# Patient Record
Sex: Male | Born: 1945 | Race: White | Hispanic: No | Marital: Married | Smoking: Never smoker
Health system: Southern US, Community
[De-identification: ages and names within clinical notes are randomized; demographics above are authoritative.]

## PROBLEM LIST (undated history)

## (undated) DIAGNOSIS — Z950 Presence of cardiac pacemaker: Secondary | ICD-10-CM

## (undated) DIAGNOSIS — I700 Atherosclerosis of aorta: Secondary | ICD-10-CM

## (undated) DIAGNOSIS — R51 Headache: Secondary | ICD-10-CM

## (undated) DIAGNOSIS — F329 Major depressive disorder, single episode, unspecified: Secondary | ICD-10-CM

## (undated) DIAGNOSIS — K5792 Diverticulitis of intestine, part unspecified, without perforation or abscess without bleeding: Secondary | ICD-10-CM

## (undated) DIAGNOSIS — C61 Malignant neoplasm of prostate: Secondary | ICD-10-CM

## (undated) DIAGNOSIS — T8859XA Other complications of anesthesia, initial encounter: Secondary | ICD-10-CM

## (undated) DIAGNOSIS — R519 Headache, unspecified: Secondary | ICD-10-CM

## (undated) DIAGNOSIS — T4145XA Adverse effect of unspecified anesthetic, initial encounter: Secondary | ICD-10-CM

## (undated) DIAGNOSIS — I10 Essential (primary) hypertension: Secondary | ICD-10-CM

## (undated) DIAGNOSIS — I499 Cardiac arrhythmia, unspecified: Secondary | ICD-10-CM

## (undated) DIAGNOSIS — N183 Chronic kidney disease, stage 3 unspecified: Secondary | ICD-10-CM

## (undated) DIAGNOSIS — F32A Depression, unspecified: Secondary | ICD-10-CM

## (undated) DIAGNOSIS — I5020 Unspecified systolic (congestive) heart failure: Secondary | ICD-10-CM

## (undated) DIAGNOSIS — J189 Pneumonia, unspecified organism: Secondary | ICD-10-CM

## (undated) DIAGNOSIS — F419 Anxiety disorder, unspecified: Secondary | ICD-10-CM

## (undated) DIAGNOSIS — M1990 Unspecified osteoarthritis, unspecified site: Secondary | ICD-10-CM

## (undated) DIAGNOSIS — L989 Disorder of the skin and subcutaneous tissue, unspecified: Secondary | ICD-10-CM

## (undated) HISTORY — PX: BACK SURGERY: SHX140

## (undated) HISTORY — DX: Disorder of the skin and subcutaneous tissue, unspecified: L98.9

## (undated) HISTORY — PX: APPENDECTOMY: SHX54

## (undated) HISTORY — PX: CARDIAC CATHETERIZATION: SHX172

## (undated) HISTORY — PX: NASAL SINUS SURGERY: SHX719

## (undated) HISTORY — PX: VASECTOMY: SHX75

## (undated) HISTORY — PX: PROSTATECTOMY, SIMPLE, ROBOT ASSISTED, LAPAROSCOPIC: SHX7390

## (undated) HISTORY — DX: Unspecified systolic (congestive) heart failure: I50.20

## (undated) HISTORY — DX: Essential (primary) hypertension: I10

## (undated) HISTORY — PX: HERNIA REPAIR: SHX51

## (undated) HISTORY — DX: Chronic kidney disease, stage 3 unspecified: N18.30

## (undated) HISTORY — DX: Malignant neoplasm of prostate: C61

## (undated) HISTORY — DX: Atherosclerosis of aorta: I70.0

## (undated) HISTORY — PX: KNEE ARTHROSCOPY: SUR90

---

## 1999-11-28 ENCOUNTER — Inpatient Hospital Stay (HOSPITAL_COMMUNITY): Admission: EM | Admit: 1999-11-28 | Discharge: 1999-11-29 | Payer: Self-pay | Admitting: Emergency Medicine

## 1999-11-28 ENCOUNTER — Encounter: Payer: Self-pay | Admitting: Cardiology

## 2002-04-27 ENCOUNTER — Encounter: Admission: RE | Admit: 2002-04-27 | Discharge: 2002-04-27 | Payer: Self-pay

## 2002-05-16 ENCOUNTER — Ambulatory Visit (HOSPITAL_COMMUNITY): Admission: RE | Admit: 2002-05-16 | Discharge: 2002-05-17 | Payer: Self-pay | Admitting: Neurosurgery

## 2002-05-16 ENCOUNTER — Encounter: Payer: Self-pay | Admitting: Neurosurgery

## 2002-05-26 ENCOUNTER — Inpatient Hospital Stay (HOSPITAL_COMMUNITY): Admission: AD | Admit: 2002-05-26 | Discharge: 2002-05-31 | Payer: Self-pay | Admitting: Neurosurgery

## 2002-05-26 ENCOUNTER — Encounter: Payer: Self-pay | Admitting: Neurosurgery

## 2006-02-05 ENCOUNTER — Encounter (INDEPENDENT_AMBULATORY_CARE_PROVIDER_SITE_OTHER): Payer: Self-pay | Admitting: *Deleted

## 2006-02-05 ENCOUNTER — Inpatient Hospital Stay (HOSPITAL_COMMUNITY): Admission: RE | Admit: 2006-02-05 | Discharge: 2006-02-06 | Payer: Self-pay | Admitting: Urology

## 2007-05-25 ENCOUNTER — Ambulatory Visit (HOSPITAL_COMMUNITY): Admission: RE | Admit: 2007-05-25 | Discharge: 2007-05-25 | Payer: Self-pay | Admitting: Family Medicine

## 2008-01-11 IMAGING — CR DG CHEST 2V
2 series · 2 of 2 positions shown · non-contrast
Comparison: Report dated 05/16/2002.

CLINICAL DATA: Prostate cancer. Preoperative evaluation.

CHEST - 2 VIEW

[view not recorded (1 of 2)]
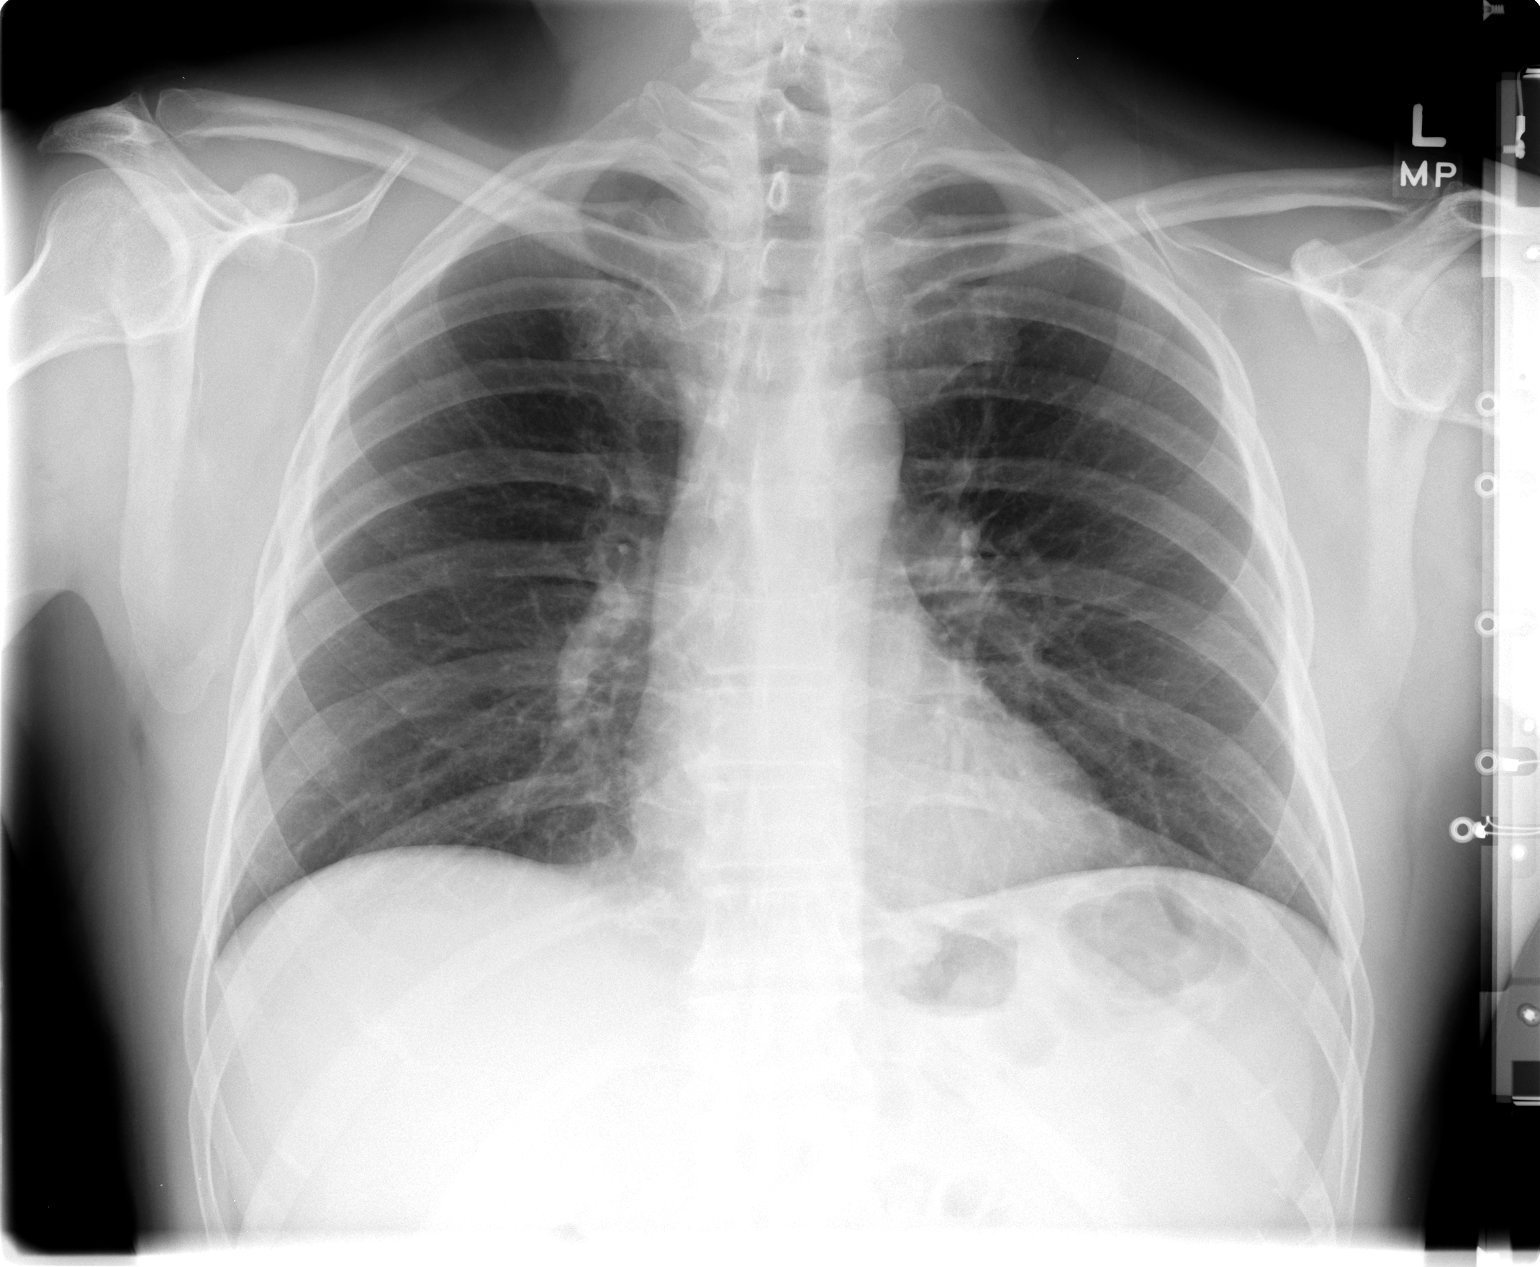

[view not recorded (2 of 2)]
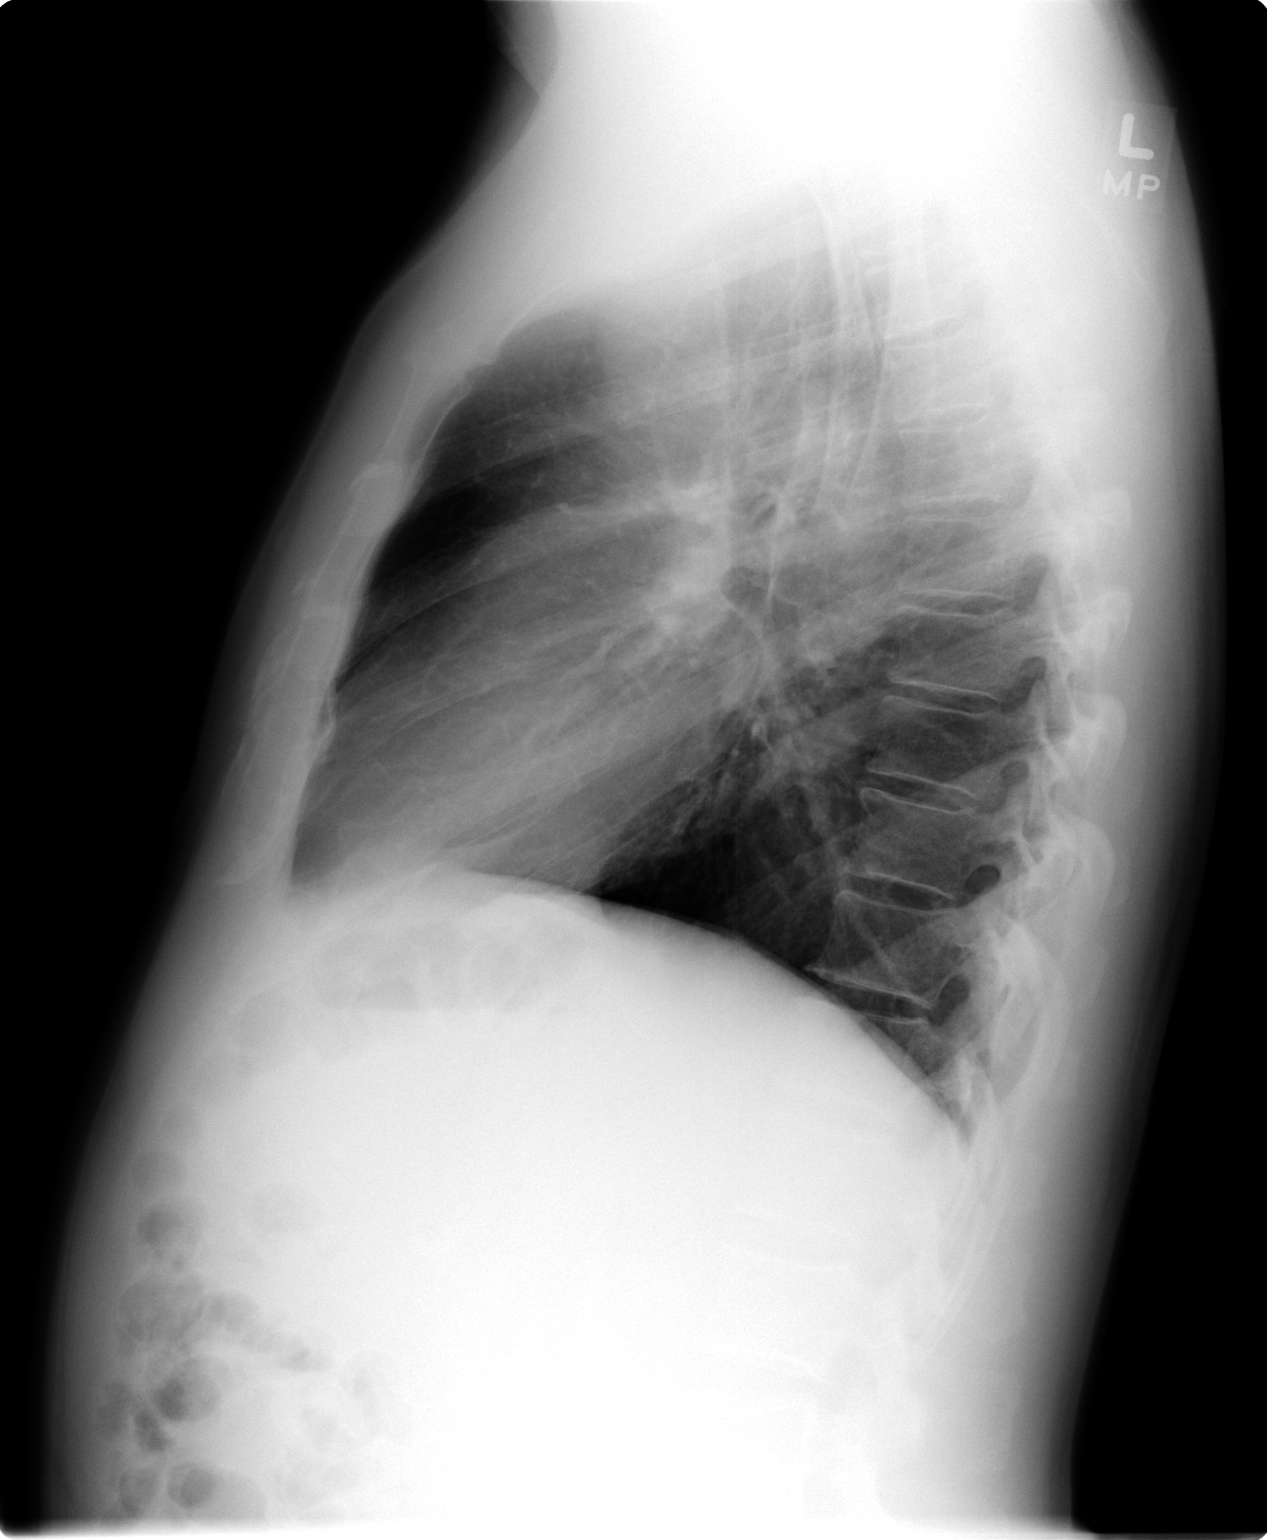

[2 of 2 positions shown; findings below may reference images not displayed]

FINDINGS: Normal sized heart. Clear lungs. Minimal diffuse peribronchial
thickening. Minimal lower thoracic spine degenerative changes. Moderate
bilateral inferior acromioclavicular spur formation.

IMPRESSION

Minimal chronic bronchitic changes. No acute abnormality.

## 2010-03-30 HISTORY — PX: COLONOSCOPY: SHX174

## 2010-07-22 NOTE — H&P (Signed)
NAME:  LYAL, HUSTED NO.:  [id number]   MEDICAL RECORD NO.:  [id number]                   PATIENT TYPE:  OIB   LOCATION:  2899                                 FACILITY:  [facility]   PHYSICIAN:  Hilda Lias, M.D.                DATE OF BIRTH:  1946-01-07   DATE OF ADMISSION:  05/16/2002  DATE OF DISCHARGE:                                HISTORY & PHYSICAL   HISTORY OF PRESENT ILLNESS:  Mr. Nicklaus is a gentleman who came to my office  complaining of back pain for many years.  Sometimes the pain is quite  intense and he has difficulty getting out of bed.  Sometimes the pain goes  down into the hip and then to the thigh, but not  ________.  He denies any  weakness.  He denies any sensory changes.  The patient had an MRI and was  sent to [place] for evaluation.   PAST MEDICAL HISTORY:  Nose and knee surgery, he also had appendectomy and  inguinal hernia repair.   ALLERGIES:  He is not allergic to medications.   SOCIAL HISTORY:  The patient drinks socially, he does not smoke.   FAMILY HISTORY:  Mother is 29 with a stroke. There is a history of high  blood pressure in his family.   REVIEW OF SYSTEMS:  Positive for back pain, leg pain, sinus headache.   PHYSICAL EXAMINATION:  HEENT:  Normal.  NECK:  Normal.  LUNGS:  Clear.  HEART:  Heart sounds normal.  ABDOMEN:  Normal.  EXTREMITIES:  Normal pulses.  There is a scar in the knee from previous  surgery.  NEUROLOGIC:  Mental status normal.  Cranial nerves normal.  Strength 5/5.  He has weakness of the left foot at 4/5, with repetitive movement down to  3/5.  Also, the patient complains of cramps mostly when he was doing this  maneuver.  The right leg is completely normal.  Sensation normal,  coordination normal.   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  Also, a small  extraforaminal herniated disk.   RECOMMENDATIONS:  The patient wanted to proceed with surgery.  I told him  that this surgery would help him most likely with the pain that he is  complaining of in his left leg, but I do not think it would change overall  his chronic back pain.  He does not want to continue with more conservative  treatment because this is something that is  getting chronic.  He was surprised about how weak he was in the left foot.  His surgery will be a left L4-L5 laminotomy/foraminotomy and the decision  for diskectomy will be made during  surgery.  He knows all the risks such as  infection, CSF leak, worsening of pain tolerance, and the need for further  surgery.                                                Hilda Lias, M.D.    EB/MEDQ  D:  05/16/2002  T:  05/17/2002  Job:  [id number]

## 2010-07-22 NOTE — Op Note (Signed)
NAMEJAMAURIE, Phillip Duran NO.:  [id number]   MEDICAL RECORD NO.:  [id number]          PATIENT TYPE:  INP   LOCATION:  0001                         FACILITY:  [facility]   PHYSICIAN:  Heloise Purpura, MD      DATE OF BIRTH:  May 02, 1945   DATE OF PROCEDURE:  02/05/2006  DATE OF DISCHARGE:                               OPERATIVE REPORT   PREOPERATIVE DIAGNOSIS:  Clinically localized adenocarcinoma of  prostate.   POSTOPERATIVE DIAGNOSIS:  Clinically localized adenocarcinoma of  prostate.   PROCEDURE:  Robotic assisted laparoscopic radical prostatectomy  (bilateral nerve sparing).   SURGEON:  Heloise Purpura, M.D.   ASSISTANT:  Dr. Garrison Columbus   COMPLICATIONS:  None.   ANESTHESIA:  General.   ESTIMATED BLOOD LOSS:  50 mL.   INTRAVENOUS FLUIDS:  1400 mL of lactated Ringer's.   SPECIMEN:  Prostate and seminal vesicles.   DRAINS:  1. 20-French Coude catheter.  2. #19 Blake pelvic drain.   INDICATIONS:  Mr. Heinlen is a 65 year old gentleman with clinical stage  T2B prostate cancer with a PSA of 3.97 and Gleason score 3 +3 equals 6.  His pretreatment IPSS score was 7 with an IIEF score of 22.  After  discussing management options for clinically localized prostate cancer,  the patient elects to proceed with the above procedure.  Potential risks  and benefits were discussed with the patient.  He consented.   DESCRIPTION OF PROCEDURE:  The patient was taken to the operating room  and a general anesthetic was administered.  He was given preoperative  antibiotics, placed in the dorsal lithotomy position, and prepped and  draped in the usual sterile fashion.  A preoperative time-out was  performed.  The Foley catheter was then inserted into the bladder.  A  site was selected approximately 18 cm from the pubic symphysis and just  to the left of the umbilicus for placement of the camera port.  This was  placed using a standard open Hasson technique.  This allowed entry into  the peritoneal cavity under direct vision.  A 12 mm port was placed and  a pneumoperitoneum was established.  With a 0 degrees lens, the abdomen  was inspected and there was no evidence of any intra-abdominal injuries  or other abnormalities.  The remaining ports were then placed.  Bilateral 8 mm robotic ports were placed 16 cm from the pubic symphysis  and 10 cm lateral to the camera port.  An additional 8 mm robotic port  was placed in the far left lateral abdominal wall.  A 5 mm port was  placed between the camera port and the right robotic port.  An  additional 12 mm port was placed in the far right lateral abdominal wall  for laparoscopic assistance.  All ports placed under direct vision  without difficulty.  The surgical cart was then docked.  With the aid of  cautery scissors, the bladder was reflected posteriorly allowing entry  into space of Retzius and identification of the endopelvic fascia and  prostate.  The endopelvic fascia was then incised from the apex  back to  the base of the prostate bilaterally and the underlying levator muscle  fibers were swept laterally off the prostate.  This isolated dorsal  venous complex which was stapled and divided with a 45 mm flex ETS  stapler.  Attention then turned to the bladder neck which was identified  with the aid of Foley catheter manipulation.  The anterior bladder neck  was incised, thereby exposing the Foley catheter.  The Foley catheter  balloon was deflated.  The catheter was brought into the operative field  and used to retract the prostate anteriorly.  This exposed the posterior  bladder neck which was then incised and the space between the bladder  neck and prostate was dissected posteriorly until the vasa deferentia  and seminal vesicles were identified.  The vasa deferentia were isolated  and divided and lifted anteriorly.  The seminal vesicles were also  dissected free with care to control seminal vesicle arterial blood   supply.  The seminal vesicles were then lifted anteriorly.  The space  between Denonvillier's fascia and the anterior rectum was bluntly  developed thereby isolating the vascular pedicles of the prostate.  Attention then turned to the anterior aspect of the prostate.  The  lateral prostatic fascia was incised bilaterally allowing the  neurovascular bundles to be swept laterally and posteriorly off the  prostate.  The vascular pedicles of prostate were then ligated with  hemoclips and divided with sharp cold scissor dissection above the level  of the neurovascular bundles.  The neurovascular bundles were then swept  out off the apex of the prostate and urethra.  The urethra was then  transected and the prostate specimen was placed up into the abdomen for  later removal.  The pelvis was then copiously irrigated and hemostasis  was ensured.  There was no evidence of a rectal injury.  Attention then  turned to the urethral anastomosis.  A 2-0 Vicryl slip-knot was placed  between the bladder neck and urethra at the 6 o'clock position to  reapproximate these structures.  A double-armed 3-0 Monocryl suture was  then used to perform a 360 degree running tension-free anastomosis  between the bladder neck and urethra.  A new 20-French Coude catheter  was inserted into the bladder and irrigated.  There were no blood clots  within the bladder and anastomosis appeared to be watertight.  A #19  Blake drain was then brought through the left robotic port and  appropriately positioned in the pelvis.  It was secured to skin with a  nylon suture.  The surgical cart was then undocked.  The prostate  specimen was placed into the Endopouch retrieval bag via the  periumbilical port site for later removal.  A 0 Vicryl suture was then  used to close the right lateral 12 mm port site with the aid of the  suture passer.  All remaining ports were then removed under direct vision.  The prostate specimen was then  removed intact within the  Endopouch retrieval bag via the periumbilical port site.  This fascial  opening was then closed with a running 0 Vicryl suture.  All ports were  then injected with quarter percent Marcaine, reapproximated at the skin  level with staples.  Sterile dressings were applied.  The patient  appeared to tolerate the procedure well without complications.  He was  able to be extubated and transferred to recovery unit in satisfactory  condition.           ______________________________  Heloise Purpura, MD  Electronically Signed     LB/MEDQ  D:  02/05/2006  T:  02/05/2006  Job:  [id number]

## 2010-07-22 NOTE — Op Note (Signed)
   NAME:  Phillip Duran, GAINS NO.:  [id number]   MEDICAL RECORD NO.:  [id number]                   PATIENT TYPE:  INP   LOCATION:  3037                                 FACILITY:  [facility]   PHYSICIAN:  Hilda Lias, M.D.                DATE OF BIRTH:  09/13/1945   DATE OF PROCEDURE:  05/27/2002  DATE OF DISCHARGE:                                 OPERATIVE REPORT   PREOPERATIVE DIAGNOSIS:  Cerebrospinal fluid leak left L4-5.   POSTOPERATIVE DIAGNOSIS:  Cerebrospinal fluid leak left L4-5.   OPERATION:  Suture of the dura mater using Prolene and Tissell microscope.   SURGEON:  Hilda Lias, M.D.   ASSISTANT:  Payton Doughty, M.D.   INDICATIONS FOR PROCEDURE:  The patient is a 65 year old gentleman who about  12 days ago underwent surgery for a left L4-5 foraminotomy.  The patient did  well but immediately after surgery developed nausea and vomiting.  He has a  small arachnoid pouch.  Never-the-less he continued to have nausea and  vomiting.  The following day, the patient was doing fine.  He had no  complaint what-so-ever.  He wanted to go home.  He was discharged.  A week  later, which was this past Saturday, he developed headache.  The headache  had no relation between sitting and lying flat.  I saw him yesterday  afternoon in my office and because of that, I brought him immediately to the  hospital where I did a myelogram which shows dural leak at the L4-5.  Because of that, he is taken today for surgery.   DESCRIPTION OF PROCEDURE:  The patient was taken to the operating room and  positioned in a prone manner.  The back was prepped with Betadine.  We  opened the previous incision and we inserted a retractor.  We brought the  microscope into the area.  Indeed right at the dorsal aspect of the L5 nerve  root there was a small hole.  Using Prolene continuous stitches were used to  close the area of the dura mater.  Then mattress was put on top of that  one.  Valsalva maneuver was negative twice up to 40.  The Tissell was applied to  the epidural space.  From there on, the wound was closed with Bactrim and  Steri-Strips.  The patient is going back to 3000 where he is going to remain  flat in bed for at least 48 hours before he is discharged.                                               Hilda Lias, M.D.    EB/MEDQ  D:  05/27/2002  T:  05/28/2002  Job:  [id number]

## 2010-07-22 NOTE — Cardiovascular Report (Signed)
[facility]  Patient:    CLERANCE, UMLAND                       MRN: [id number] Proc. Date: 11/28/99 Adm. Date:  [id number] Disc. Date: [id number] Attending:  Loreli Dollar CC:         Kizzie Furnish, M.D.  Cath Lab   Cardiac Catheterization  REASON FOR PROCEDURE:  Mr. Cuff is a 65 year old male who was admitted with chest pain and shortness of breath and elevation of his blood pressure.  He had a similar episode about 10 years ago, and had a negative cardiac catheterization in [place], [place].  His cardiac enzymes are unremarkable, and his EKG is normal.  PROCEDURE: 1. Left heart catheterization. 2. Selective right and left coronary arteriography. 3. Ventriculography from the RAO projection.  COMPLICATIONS:  None.  PROCEDURE:  The patient was prepped and draped in the usual sterile fashion exposing the right groin.  Applying local anesthetic with 1% Xylocaine.  Using the Seldinger technique a 6 French introducer sheath was placed into the right femoral artery.  Selective right and left coronary arteriography and ventriculography in the RAO projection was performed.  RESULTS: 1. Hemodynamic monitoring:  Central aortic pressure was 129/79, left    ventricular pressure was 132/19, with no aortic valve gradient noted at the    time of pull back. 2. Ventriculography:  Ventriculography in the RAO projection revealed normal    left ventricular systolic function, ejection fraction was 59%, the end    diastolic pressure was 19.  The anterior basilar segment appeared to be    mildly hypokinetic compared to the other wall segments.  No mitral    regurgitation was noted. 3. Coronary arteriography:  There was no calcification noted on fluoroscopy.  IMPRESSION: 1. Left main short, free of disease. 2. LAD.  The LAD extended down to the apex of the heart and there was one    large diagonal branch.  This system was free of disease. 3.  Optional diagonal.  The optional diagonal was a very large vessel with    ostial 20% narrowing.  The remainder of the vessel was free of disease. 4. Circumflex.  The circumflex was a large dominant vessel with two large OMs    and a large PDA.  This system was free of disease. 5. Right coronary artery.  The right coronary artery was a small dominant    vessel giving arise to two small branches to the RV.  CONCLUSION: 1. Normal left ventricular systolic function. 2. Minimal ostial narrowing of the optional diagonal.  DISCUSSION:  At this point I do not see a cardiac explanation for his chest pain.  His nitroglycerin, heparin, and beta blocker will be stopped.  He will be discharged later today for followup with Dr. Fayrene Fearing. DD:  11/29/99 TD:  11/29/99 Job: [id number]

## 2010-07-22 NOTE — Discharge Summary (Signed)
NAMEDANDRA, VELARDI NO.:  [id number]   MEDICAL RECORD NO.:  [id number]          PATIENT TYPE:  INP   LOCATION:  1408                         [facility]AC[facility]   PHYSICIAN:  Heloise Purpura, MD      DATE OF BIRTH:  12-Oct-1945   DATE OF ADMISSION:  02/05/2006  DATE OF DISCHARGE:  02/06/2006                               DISCHARGE SUMMARY   ADMISSION DIAGNOSIS:  Prostate cancer.   DISCHARGE DIAGNOSIS:  Prostate cancer.   PROCEDURES:  Robotic assisted laparoscopic radical prostatectomy.   HISTORY AND PHYSICAL:  For full details please see admission history and  physical.  Briefly, Mr. Oren is a 65 year old gentleman with clinical  stage T2-B prostate cancer with a PSA of 3.97 and Gleason score of 3+3=6  adenocarcinoma.  After discussing management options for clinically  localized prostate cancer, the patient elected to proceed with the above  procedure.   HOSPITAL COURSE:  The patient was taken to the operating room on  February 05, 2006, and underwent a robotic-assisted laparoscopic radical  prostatectomy.  He tolerated this procedure well and without  complications.  Postoperatively, he was able to begin ambulating the  night of surgery, which he did without difficulty.  By postoperative day  #1, he was able to begin a clear liquid diet and was ambulating without  problems.  He maintained excellent output from his Foley catheter with  minimal output from his pelvic drain, and therefore his pelvic drain was  able to be removed.  He tolerated clear liquids for breakfast and lunch  and by the afternoon of postoperative day #1, he had met all discharge  criteria and was able to be discharged home in excellent condition.   DISPOSITION:  Home.   DISCHARGE MEDICATIONS:  Mr. Kuechle was instructed to resume his regular  home medications excepting any aspirin, nonsteroidal anti-inflammatory  drugs, or herbal supplements.  He was given a prescription to take  Vicodin as  needed for pain, told to use Colace as needed for a stool  softener, and given a prescription to begin Cipro prior to Foley  catheter removal.   DISCHARGE INSTRUCTIONS:  The patient was given thorough discharge  instructions regarding postoperative care.  Specifically, he was  encouraged to be ambulatory but told to refrain from any heavy lifting,  strenuous activity, or driving.  He was told to gradually advance his  diet over the course of the next couple of days.  He was also given  instructions on routine Foley catheter care and given a leg bag for  daytime usage.   FOLLOW UP:  Mr. Hedgepath will follow up in 1 week for Foley catheter removal  as well as discuss his surgical pathology in detail.           ______________________________  Heloise Purpura, MD  Electronically Signed     LB/MEDQ  D:  02/06/2006  T:  02/07/2006  Job:  [id number]

## 2010-07-22 NOTE — H&P (Signed)
   NAME:  Phillip Duran, Phillip Duran NO.:  [id number]   MEDICAL RECORD NO.:  [id number]                   PATIENT TYPE:  INP   LOCATION:  3037                                 FACILITY:  [facility]   PHYSICIAN:  Hilda Lias, M.D.                DATE OF BIRTH:  October 03, 1945   DATE OF ADMISSION:  05/26/2002  DATE OF DISCHARGE:                                HISTORY & PHYSICAL   HISTORY OF PRESENT ILLNESS:  This patient was admitted previously on May 16, 2002 for left L4-L5 foraminotomy.  The patient was discharged three to  four hours later with no complaints whatsoever.  On Saturday he developed  headache.  According to him and his wife there was no relationship between  being flat in bed or standing.  He called me on Monday and I brought him  immediately to my office and because of my clinical findings I brought him  last night to the hospital where I did a myelogram which showed indeed he  has a small leak at the L4-L5 on the left side.  Because of that he was  admitted for exploration on May 27, 2002.   PAST MEDICAL HISTORY:  Nasal and knee surgeries, L4-L5 foraminotomy.   ALLERGIES:  The patient is not allergic to any medications.   SOCIAL HISTORY:  He does not smoke.  He drinks socially.   FAMILY HISTORY:  He has history of high blood pressure in his family and  mother is 50 with a stroke.   REVIEW OF SYMPTOMS:  Positive for headache at the present time.   PHYSICAL EXAMINATION:  GENERAL:  Patient who is complaining of headache  which has nothing to do with his position.  Lying flat or standing it is the  same headache.  HEENT:  Head, ears, nose and throat normal.  NECK:  Normal.  LUNGS:  Clear.  HEART:  Sounds normal.  ABDOMEN:  Normal.  EXTREMITIES:  Normal pulses.  NEUROLOGICAL:  Mental status is normal. Cranial nerves II-XII normal.  Strength is normal.  Sensation is normal.  In the lumbar spine it is  difficult to see if there is any evidence of  any fluid collection.  The  wound looks fine and flat.   IMPRESSION:  Rule out cerebrospinal fluid leak.   RECOMMENDATIONS:  The patient is being admitted today, May 26, 2002 for  lumbar myelogram and based on these findings will advise about surgery.                                               Hilda Lias, M.D.    EB/MEDQ  D:  05/27/2002  T:  05/27/2002  Job:  [id number]

## 2010-07-22 NOTE — Op Note (Signed)
NAME:  Phillip Duran, Phillip Duran NO.:  [id number]   MEDICAL RECORD NO.:  [id number]                   PATIENT TYPE:  OIB   LOCATION:  2899                                 FACILITY:  [facility]   PHYSICIAN:  Hilda Lias, M.D.                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   PROCEDURE:  Left L4-5 foraminotomy, decompression of the L4 and L5 nerve  roots.  C-arm Metrix system as well as the microscope.   SURGEON:  Hilda Lias, M.D.   ASSISTANT:  Coletta Memos, M.D.   INDICATIONS FOR PROCEDURE:  The patient was admitted because of back and  left leg pain. It was found that he had weakness of dorsiflexion of the left  leg.  The patient wanted to go ahead with surgery because he was not getting  better.  He knew that the procedure would help with the pain of the left leg  and the weakness, but probably will not change his back pain. The risks were  explained in the history and physical.   DESCRIPTION OF PROCEDURE:  The patient was taken to the OR and he was  positioned in a prone manner. The back was prepped with Betadine.  Identification of the L4-5 in the AP view was done with the C-arm.  Incision  in the midline was made through the skin, subcutaneous tissue, and fascia.  We were straight down to the area between 4-5. We brought the microscope  into the area and we viewed the lower lamina of L4 and the upper of L5.  The  yellow ligament was also excised.  With the drill, we made the laminotomy  wider.  With the help of the microscope, we found the L4 nerve root and then  it was followed down to the L5.  There was some bulging laterally, but there  was no compromise of the nerve  root by the disk.  What we found is that the  foramen for the L5 nerve root was quite narrow. Decompression was achieved  and plenty of room was left for both the L4 and L5 nerve roots.  Having done  this, we proceeded with quite a bit of lysis of adhesions.  Then we had good  decompression.  Tisseel was left in the dural space. Having done this, the  area was irrigated. Valsalva maneuver was negative.  The wound was closed  with Vicryl and Steri-Strips.  Hilda Lias, M.D.    EB/MEDQ  D:  05/16/2002  T:  05/17/2002  Job:  [id number]

## 2010-07-22 NOTE — H&P (Signed)
Phillip Duran, Phillip Duran NO.:  [id number]   MEDICAL RECORD NO.:  [id number]          PATIENT TYPE:  INP   LOCATION:  NA                           FACILITY:  [facility]   PHYSICIAN:  Heloise Purpura, MD      DATE OF BIRTH:  [birth year]   DATE OF ADMISSION:  02/05/2006  DATE OF DISCHARGE:                              HISTORY & PHYSICAL   CHIEF COMPLAINT:  Prostate cancer.   HISTORY OF PRESENT ILLNESS:  Phillip Duran is a gentleman with recently  diagnosed clinical stage T2B prostate cancer with a PSA of 3.97 and  Gleason score of 3+3 = 6 adenocarcinoma.  The patient's outside  pathology was revealed and demonstrated low volume disease with 5% from  each side.  However, the patient did have a significant nodule on his  prostate at the time of examination.  Due to the possibility of locally  advanced cancer and the patient's strong desire to undergo a nerve-  sparing procedure, an endorectal coil MRI was performed.  This did not  demonstrate any obvious extra prostatic extension.  After discussing the  finding with the patient, he did elect to proceed with a bilateral nerve  sparing procedure after reviewing the risks and benefits.   PAST MEDICAL HISTORY:  Hypertension.   PAST SURGICAL HISTORY:  1. Back surgery.  2. Nasal surgery.  3. Knee arthroscopy.  4. Vasectomy.  5. Right inguinal hernia repair x3.  6. Left inguinal hernia repair.  7. Appendectomy.   MEDICATIONS:  Lisinopril 40 mg p.o. daily.   ALLERGIES:  The patient does not have any true drug allergies.  He does  have an intolerance to MORPHINE which causes nausea and vomiting.   FAMILY HISTORY:  Positive for history of stroke, hypertension and  diabetes.   SOCIAL HISTORY:  The patient is a retired high school principal.  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  He is  married.  He denies tobacco use.  He drinks one glass of alcohol per  day.   PHYSICAL EXAM:  CONSTITUTIONAL:   Well-nourished, well-developed, age-  appropriate male in no acute distress.  CARDIOVASCULAR:  Regular rate and rhythm without obvious murmurs.  LUNGS:  Clear bilaterally.  ABDOMEN:  Soft, nontender, nondistended without abdominal masses.  DRE:  The patient does have nodularity at the left mid and apical  portions of the prostate consistent with a T2B lesion.   IMPRESSION:  Clinically localized prostate cancer.   PLAN:  Phillip Duran will undergo a robotic assisted laparoscopic radical  prostatectomy.  He will then be admitted to the hospital for routine  postoperative care.           ______________________________  Heloise Purpura, MD  Electronically Signed     LB/MEDQ  D:  02/04/2006  T:  02/05/2006  Job:  [id number]

## 2010-07-22 NOTE — Discharge Summary (Signed)
   NAME:  ABID, BOLLA NO.:  [id number]   MEDICAL RECORD NO.:  [id number]                   PATIENT TYPE:  INP   LOCATION:  3037                                 FACILITY:  [facility]   PHYSICIAN:  Clydene Fake, M.D.               DATE OF BIRTH:  Dec 11, 1945   DATE OF ADMISSION:  05/26/2002  DATE OF DISCHARGE:  05/31/2002                                 DISCHARGE SUMMARY   DIAGNOSIS:  Cerebrospinal fluid leak, status post previous lumbar  laminectomy.   PROCEDURE:  On May 27, 2002, exploration of prior lumbar laminectomy and  repair of cerebrospinal fluid leak.   REASON FOR ADMISSION:  The patient underwent lumbar laminectomy on March 12  ______ and had drainage and headache diagnosed with CSF leak, L4-5.  Patient  admitted and taken to the operating room for exploration and occlusion of  the leak.  The procedure went without problem.  Postop, he was at flat  bedrest for a few days and started mobilizing, no headache, no drainage from  the incision, ambulated well and was discharged home on May 31, 2002 in  stable condition.   FOLLOWUP:  Followup will be with Dr. Hilda Lias.                                               Clydene Fake, M.D.    JRH/MEDQ  D:  07/08/2002  T:  07/09/2002  Job:  [id number]

## 2010-11-28 LAB — CREATININE, SERUM
Creatinine, Ser: 1.16
GFR calc Af Amer: >60
GFR calc non Af Amer: >60

## 2013-01-23 ENCOUNTER — Other Ambulatory Visit: Payer: Self-pay | Admitting: Orthopedic Surgery

## 2013-01-23 ENCOUNTER — Other Ambulatory Visit (HOSPITAL_COMMUNITY): Payer: Self-pay | Admitting: Diagnostic Radiology

## 2013-01-23 DIAGNOSIS — M25561 Pain in right knee: Secondary | ICD-10-CM

## 2013-01-28 ENCOUNTER — Ambulatory Visit
Admission: RE | Admit: 2013-01-28 | Discharge: 2013-01-28 | Disposition: A | Discharge status: Home / Self Care | Payer: Worker's Compensation | Source: Ambulatory Visit | Attending: Orthopedic Surgery | Admitting: Orthopedic Surgery

## 2013-01-28 DIAGNOSIS — M25561 Pain in right knee: Secondary | ICD-10-CM

## 2013-02-04 ENCOUNTER — Other Ambulatory Visit: Payer: Self-pay | Admitting: Orthopedic Surgery

## 2016-03-28 ENCOUNTER — Ambulatory Visit (INDEPENDENT_AMBULATORY_CARE_PROVIDER_SITE_OTHER): Payer: Medicare Other | Admitting: Internal Medicine

## 2016-03-28 ENCOUNTER — Encounter (INDEPENDENT_AMBULATORY_CARE_PROVIDER_SITE_OTHER): Payer: Self-pay

## 2016-03-28 ENCOUNTER — Encounter: Payer: Self-pay | Admitting: Internal Medicine

## 2016-03-28 VITALS — BP 162/80 | HR 38 | Ht 67.0 in | Wt 152.0 lb

## 2016-03-28 DIAGNOSIS — Z01812 Encounter for preprocedural laboratory examination: Secondary | ICD-10-CM | POA: Diagnosis not present

## 2016-03-28 DIAGNOSIS — I441 Atrioventricular block, second degree: Secondary | ICD-10-CM

## 2016-03-28 DIAGNOSIS — R0602 Shortness of breath: Secondary | ICD-10-CM

## 2016-03-28 NOTE — Patient Instructions (Addendum)
Medication Instructions:  Your physician recommends that you continue on your current medications as directed. Please refer to the Current Medication list given to you today.   Labwork: TODAY: BMET / CBC w/ diff  Testing/Procedures: Your physician has requested that you have an echocardiogram. Echocardiography is a painless test that uses sound waves to create images of your heart. It provides your doctor with information about the size and shape of your heart and how well your heart's chambers and valves are working. This procedure takes approximately one hour. There are no restrictions for this procedure--- TOMORROW (03/29/16) at [facility] at 8:00 AM     Your physician has recommended that you have a pacemaker inserted. A pacemaker is a small device that is placed under the skin of your chest or abdomen to help control abnormal heart rhythms. This device uses electrical pulses to prompt the heart to beat at a normal rate. Pacemakers are used to treat heart rhythms that are too slow. Wire (leads) are attached to the pacemaker that goes into the chambers of you heart. This is done in the hospital and usually requires and overnight stay. Please see the instruction sheet given to you today for more information.    Follow-Up: Follow-up for a wound check in the Device Clinic 10-14 days from 03/30/16  and with Dr. Lovena Le 91 days from  03/30/16 .   Any Other Special Instructions Will Be Listed Below ---  Please wash with the CHG Soap the night before and morning of procedure (follow instruction page "Preparing For Surgery").   Report to the [place] [facility] on 03/30/16 at 5:30 AM   Nothing to eat or drink after midnight the night before procedure  Do not take any medication morning of procedure  Plan 1 night stay    If you need a refill on your cardiac medications before your next appointment, please call your pharmacy.

## 2016-03-28 NOTE — Progress Notes (Signed)
      HPI [name] is referred today by Dr. 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 He has had intermittent dyspnea and dizziness for several weeks. He has not had syncope. He has noted worsening sob but no chest pain. He went fishing this past weekend and felt like he might not be able to get his boat back on the trailer. He is on no medications to slow his heart. An ECG in his MD's office demonstrated 2:1 AV block. Allergies  Allergen Reactions  . Morphine And Related Nausea And Vomiting     Current Outpatient Prescriptions  Medication Sig Dispense Refill  . lisinopril-hydrochlorothiazide (PRINZIDE,ZESTORETIC) 20-12.5 MG tablet Take 1 tablet by mouth daily.     No current facility-administered medications for this visit.      Past Medical History:  Diagnosis Date  . Hypertension   . Prostate cancer ([place])     ROS:   All systems reviewed and negative except as noted in the HPI.   Past Surgical History:  Procedure Laterality Date  . BACK SURGERY    . NASAL SINUS SURGERY       Family History  Problem Relation Age of Onset  . Stroke Mother   . Hypertension Father   . Stroke Sister   . Stroke Sister      Social History   Social History  . Marital status: Married    Spouse name: N/A  . Number of children: N/A  . Years of education: N/A   Occupational History  . Not on file.   Social History Main Topics  . Smoking status: Never Smoker  . Smokeless tobacco: Never Used  . Alcohol use Yes  . Drug use: No  . Sexual activity: Not on file   Other Topics Concern  . Not on file   Social History Narrative  . No narrative on file     BP (!) 162/80   Pulse (!) 38   Ht 5\' 7"  (1.702 m)   Wt 152 lb (68.9 kg)   BMI 23.81 kg/m   Physical Exam:  Well appearing 71 yo man, NAD HEENT: Unremarkable Neck:  6 cm JVD, no thyromegally Lymphatics:  No adenopathy Back:  No CVA tenderness Lungs:  Clear with no wheezes HEART:  Regular brady rhythm, no  murmurs, no rubs, no clicks Abd:  soft, positive bowel sounds, no organomegally, no rebound, no guarding Ext:  2 plus pulses, no edema, no cyanosis, no clubbing Skin:  No rashes no nodules Neuro:  CN II through XII intact, motor grossly intact  EKG - from the outside office - NSR with 2:1 AV block and RBBB  Assess/Plan: 1. Symptomatic high grade (mobitz 2, second degree) AV block - I have discussed the risks/benefits/goals/expectations of PPM insertion with the patient and he wishes to proceed. 2. Dyspnea - I suspect that this is due to his heart block. However, will obtain a 2 D echo to be sure he has not developed LV dysfunction. 3. HTN - he will continue his diuretic/ACE combination.  [name].D.

## 2016-03-29 ENCOUNTER — Ambulatory Visit (HOSPITAL_COMMUNITY)
Admission: RE | Admit: 2016-03-29 | Discharge: 2016-03-29 | Disposition: A | Discharge status: Home / Self Care | Payer: Medicare Other | Source: Ambulatory Visit | Attending: Legal Medicine | Admitting: Legal Medicine

## 2016-03-29 DIAGNOSIS — R001 Bradycardia, unspecified: Secondary | ICD-10-CM | POA: Insufficient documentation

## 2016-03-29 DIAGNOSIS — R0602 Shortness of breath: Secondary | ICD-10-CM | POA: Diagnosis not present

## 2016-03-29 DIAGNOSIS — I340 Nonrheumatic mitral (valve) insufficiency: Secondary | ICD-10-CM | POA: Insufficient documentation

## 2016-03-29 LAB — ECHOCARDIOGRAM COMPLETE
CHL CUP MV DEC (S): 195
E decel time: 195 msec
E/e' ratio: 7.7
FS: 34 % (ref 28–44)
IV/PV OW: 0.95
LA diam end sys: 40 mm
LA vol A4C: 58.2 ml
LA vol: 58.4 mL
LADIAMINDEX: 2.22 cm/m2
LASIZE: 40 mm
LAVOLIN: 32.4 mL/m2
LV E/e' medial: 7.7
LVEEAVG: 7.7
LVELAT: 16.1 cm/s
LVOT area: 3.14 cm2
LVOT diameter: 20 mm
Lateral S' vel: 14.4 cm/s
MV Peak grad: 6 mmHg
MVPKAVEL: 88.1 m/s
MVPKEVEL: 124 m/s
PW: 9.33 mm — AB (ref 0.6–1.1)
TDI e' lateral: 16.1
TDI e' medial: 12.7

## 2016-03-29 LAB — CBC WITH DIFFERENTIAL/PLATELET
BASOS ABS: 0 10*3/uL (ref 0.0–0.2)
Basos: 1 %
EOS (ABSOLUTE): 0.2 10*3/uL (ref 0.0–0.4)
Eos: 3 %
Hematocrit: 44.2 % (ref 37.5–51.0)
Hemoglobin: 14.5 g/dL (ref 13.0–17.7)
IMMATURE GRANS (ABS): 0 10*3/uL (ref 0.0–0.1)
Immature Granulocytes: 0 %
LYMPHS ABS: 2.4 10*3/uL (ref 0.7–3.1)
LYMPHS: 29 %
MCH: 28.6 pg (ref 26.6–33.0)
MCHC: 32.8 g/dL (ref 31.5–35.7)
MCV: 87 fL (ref 79–97)
MONOS ABS: 1 10*3/uL — AB (ref 0.1–0.9)
Monocytes: 12 %
NEUTROS ABS: 4.6 10*3/uL (ref 1.4–7.0)
Neutrophils: 55 %
PLATELETS: 213 10*3/uL (ref 150–379)
RBC: 5.07 x10E6/uL (ref 4.14–5.80)
RDW: 14.6 % (ref 12.3–15.4)
WBC: 8.3 10*3/uL (ref 3.4–10.8)

## 2016-03-29 LAB — BASIC METABOLIC PANEL
BUN/Creatinine Ratio: 11 (ref 10–24)
BUN: 16 mg/dL (ref 8–27)
CALCIUM: 8.8 mg/dL (ref 8.6–10.2)
CHLORIDE: 107 mmol/L — AB (ref 96–106)
CO2: 23 mmol/L (ref 18–29)
Creatinine, Ser: 1.42 mg/dL — ABNORMAL HIGH (ref 0.76–1.27)
GFR calc non Af Amer: 50 mL/min/{1.73_m2} — ABNORMAL LOW (ref >59)
GFR, EST AFRICAN AMERICAN: 58 mL/min/{1.73_m2} — AB (ref >59)
Glucose: 88 mg/dL (ref 65–99)
POTASSIUM: 4.3 mmol/L (ref 3.5–5.2)
SODIUM: 147 mmol/L — AB (ref 134–144)

## 2016-03-29 NOTE — Progress Notes (Signed)
  Echocardiogram 2D Echocardiogram has been performed.  Phillip Duran 03/29/2016, 9:00 AM

## 2016-03-30 ENCOUNTER — Encounter (HOSPITAL_COMMUNITY)
Admission: RE | Disposition: A | Discharge status: Home / Self Care | Payer: Self-pay | Source: Ambulatory Visit | Attending: Internal Medicine

## 2016-03-30 ENCOUNTER — Ambulatory Visit (HOSPITAL_COMMUNITY)
Admission: RE | Admit: 2016-03-30 | Discharge: 2016-03-31 | Disposition: A | Discharge status: Home / Self Care | Payer: Medicare Other | Source: Ambulatory Visit | Attending: Internal Medicine | Admitting: Internal Medicine

## 2016-03-30 ENCOUNTER — Encounter (HOSPITAL_COMMUNITY): Payer: Self-pay | Admitting: Internal Medicine

## 2016-03-30 DIAGNOSIS — I10 Essential (primary) hypertension: Secondary | ICD-10-CM | POA: Diagnosis not present

## 2016-03-30 DIAGNOSIS — Z823 Family history of stroke: Secondary | ICD-10-CM | POA: Diagnosis not present

## 2016-03-30 DIAGNOSIS — Z8546 Personal history of malignant neoplasm of prostate: Secondary | ICD-10-CM | POA: Diagnosis not present

## 2016-03-30 DIAGNOSIS — Z885 Allergy status to narcotic agent status: Secondary | ICD-10-CM | POA: Insufficient documentation

## 2016-03-30 DIAGNOSIS — Z950 Presence of cardiac pacemaker: Secondary | ICD-10-CM

## 2016-03-30 DIAGNOSIS — Z8249 Family history of ischemic heart disease and other diseases of the circulatory system: Secondary | ICD-10-CM | POA: Insufficient documentation

## 2016-03-30 DIAGNOSIS — I441 Atrioventricular block, second degree: Secondary | ICD-10-CM | POA: Diagnosis present

## 2016-03-30 HISTORY — PX: EP IMPLANTABLE DEVICE: SHX172B

## 2016-03-30 LAB — SURGICAL PCR SCREEN
MRSA, PCR: NEGATIVE
Staphylococcus aureus: NEGATIVE

## 2016-03-30 SURGERY — PACEMAKER IMPLANT
Anesthesia: LOCAL

## 2016-03-30 MED ORDER — FAMOTIDINE IN NACL 20-0.9 MG/50ML-% IV SOLN
INTRAVENOUS | Status: DC | PRN
  Administered 2016-03-30: 20 mg via INTRAVENOUS

## 2016-03-30 MED ORDER — LIDOCAINE HCL (PF) 1 % IJ SOLN
INTRAMUSCULAR | Status: AC
  Filled 2016-03-30: qty 30

## 2016-03-30 MED ORDER — HYDROCHLOROTHIAZIDE 12.5 MG PO CAPS
12.5000 mg | ORAL_CAPSULE | Freq: Every day | ORAL | Status: DC
  Administered 2016-03-30 – 2016-03-31 (×2): 12.5 mg via ORAL
  Filled 2016-03-30 (×2): qty 1

## 2016-03-30 MED ORDER — FAMOTIDINE IN NACL 20-0.9 MG/50ML-% IV SOLN
INTRAVENOUS | Status: AC
  Filled 2016-03-30: qty 50

## 2016-03-30 MED ORDER — SODIUM CHLORIDE 0.9 % IV SOLN
INTRAVENOUS | Status: DC
  Administered 2016-03-30: 07:00:00 via INTRAVENOUS

## 2016-03-30 MED ORDER — FENTANYL CITRATE (PF) 100 MCG/2ML IJ SOLN
INTRAMUSCULAR | Status: AC
  Filled 2016-03-30: qty 2

## 2016-03-30 MED ORDER — LIDOCAINE HCL (PF) 1 % IJ SOLN
INTRAMUSCULAR | Status: DC | PRN
  Administered 2016-03-30: 40 mL via SUBCUTANEOUS

## 2016-03-30 MED ORDER — CHLORHEXIDINE GLUCONATE 4 % EX LIQD: 60.0000 mL | Freq: Once | CUTANEOUS | Status: DC

## 2016-03-30 MED ORDER — ACETAMINOPHEN 325 MG PO TABS
325.0000 mg | ORAL_TABLET | ORAL | Status: DC | PRN
  Administered 2016-03-30 – 2016-03-31 (×2): 650 mg via ORAL
  Filled 2016-03-30 (×2): qty 2

## 2016-03-30 MED ORDER — ALUM & MAG HYDROXIDE-SIMETH 200-200-20 MG/5ML PO SUSP
15.0000 mL | Freq: Four times a day (QID) | ORAL | Status: DC | PRN
  Administered 2016-03-30: 15 mL via ORAL
  Filled 2016-03-30: qty 30

## 2016-03-30 MED ORDER — MUPIROCIN 2 % EX OINT
TOPICAL_OINTMENT | Freq: Two times a day (BID) | CUTANEOUS | Status: DC
  Administered 2016-03-30: 1 via NASAL
  Administered 2016-03-31: 09:00:00 via NASAL
  Filled 2016-03-30 (×2): qty 22

## 2016-03-30 MED ORDER — NAPROXEN SODIUM 275 MG PO TABS
440.0000 mg | ORAL_TABLET | Freq: Two times a day (BID) | ORAL | Status: DC | PRN
  Filled 2016-03-30: qty 2

## 2016-03-30 MED ORDER — MUPIROCIN 2 % EX OINT
TOPICAL_OINTMENT | CUTANEOUS | Status: AC
  Administered 2016-03-30: 1 via NASAL
  Filled 2016-03-30: qty 22

## 2016-03-30 MED ORDER — MIDAZOLAM HCL 5 MG/5ML IJ SOLN
INTRAMUSCULAR | Status: AC
  Filled 2016-03-30: qty 5

## 2016-03-30 MED ORDER — LISINOPRIL 10 MG PO TABS
20.0000 mg | ORAL_TABLET | Freq: Every day | ORAL | Status: DC
  Administered 2016-03-30 – 2016-03-31 (×2): 20 mg via ORAL
  Filled 2016-03-30 (×2): qty 2

## 2016-03-30 MED ORDER — PANTOPRAZOLE SODIUM 40 MG PO TBEC
40.0000 mg | DELAYED_RELEASE_TABLET | Freq: Two times a day (BID) | ORAL | Status: DC
  Administered 2016-03-30 – 2016-03-31 (×2): 40 mg via ORAL
  Filled 2016-03-30 (×2): qty 1

## 2016-03-30 MED ORDER — SODIUM CHLORIDE 0.9 % IV SOLN
INTRAVENOUS | Status: DC | PRN
  Administered 2016-03-30: 10 mL/h via INTRAVENOUS

## 2016-03-30 MED ORDER — FENTANYL CITRATE (PF) 100 MCG/2ML IJ SOLN
INTRAMUSCULAR | Status: DC | PRN
  Administered 2016-03-30 (×2): 12.5 ug via INTRAVENOUS
  Administered 2016-03-30: 25 ug via INTRAVENOUS

## 2016-03-30 MED ORDER — LISINOPRIL-HYDROCHLOROTHIAZIDE 20-12.5 MG PO TABS: 1.0000 | ORAL_TABLET | Freq: Every day | ORAL | Status: DC

## 2016-03-30 MED ORDER — CEFAZOLIN SODIUM-DEXTROSE 2-4 GM/100ML-% IV SOLN
INTRAVENOUS | Status: AC
  Filled 2016-03-30: qty 100

## 2016-03-30 MED ORDER — SODIUM CHLORIDE 0.9 % IR SOLN
Status: AC
  Filled 2016-03-30: qty 2

## 2016-03-30 MED ORDER — CEFAZOLIN IN D5W 1 GM/50ML IV SOLN
1.0000 g | Freq: Four times a day (QID) | INTRAVENOUS | Status: AC
  Administered 2016-03-30 – 2016-03-31 (×3): 1 g via INTRAVENOUS
  Filled 2016-03-30 (×3): qty 50

## 2016-03-30 MED ORDER — HEPARIN (PORCINE) IN NACL 2-0.9 UNIT/ML-% IJ SOLN
INTRAMUSCULAR | Status: DC | PRN
  Administered 2016-03-30: 500 mL

## 2016-03-30 MED ORDER — CEFAZOLIN SODIUM-DEXTROSE 2-4 GM/100ML-% IV SOLN
2.0000 g | INTRAVENOUS | Status: AC
  Administered 2016-03-30: 2 g via INTRAVENOUS

## 2016-03-30 MED ORDER — GENTAMICIN SULFATE 40 MG/ML IJ SOLN
80.0000 mg | INTRAMUSCULAR | Status: AC
  Administered 2016-03-30: 80 mg

## 2016-03-30 MED ORDER — MIDAZOLAM HCL 5 MG/5ML IJ SOLN
INTRAMUSCULAR | Status: DC | PRN
  Administered 2016-03-30 (×4): 1 mg via INTRAVENOUS

## 2016-03-30 MED ORDER — HEPARIN (PORCINE) IN NACL 2-0.9 UNIT/ML-% IJ SOLN
INTRAMUSCULAR | Status: AC
  Filled 2016-03-30: qty 500

## 2016-03-30 MED ORDER — ONDANSETRON HCL 4 MG/2ML IJ SOLN: 4.0000 mg | Freq: Four times a day (QID) | INTRAMUSCULAR | Status: DC | PRN

## 2016-03-30 SURGICAL SUPPLY — 9 items
CABLE SURGICAL S-101-97-12 (CABLE) ×1 IMPLANT
INGEVITY MRI 7740-45CM (Lead) ×2 IMPLANT
INGEVITY MRI 7741-52CM (Lead) ×2 IMPLANT
LEAD PACING INGEVITY MRI 45CM (Lead) IMPLANT
LEAD PACING INGEVITY MRI 52CM (Lead) IMPLANT
PACEMAKER ACCOLADE DR-EL (Pacemaker) ×1 IMPLANT
PAD DEFIB LIFELINK (PAD) ×1 IMPLANT
SHEATH CLASSIC 7F (SHEATH) ×2 IMPLANT
TRAY PACEMAKER INSERTION (PACKS) ×1 IMPLANT

## 2016-03-30 NOTE — Progress Notes (Signed)
Patient remains comfortable in bed. No complaints at this time. Lt upper arm remains soft with no bleeding or hematoma noted. Will continue to monitor patient.

## 2016-03-30 NOTE — Discharge Summary (Signed)
ELECTROPHYSIOLOGY PROCEDURE DISCHARGE SUMMARY    Patient ID: Phillip Duran,  MRN: [id number], DOB/AGE: 1945/04/08 71 y.o.  Admit date: 03/30/2016 Discharge date: 03/31/16  Primary Care Physician: Lillard Anes, MD  Electrophysiologist: Dr. Lovena Le  Primary Discharge Diagnosis:  1. 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 HTN  Allergies  Allergen Reactions  . Morphine And Related Nausea And Vomiting     Procedures This Admission:  1.  Implantation of a BSCi dual chamber PPM on 03/30/16 by Dr Lovena Le.  The patient received a [employer] (serial number [id number] pacemaker, [employer] (serial number [id number]) right atrial lead and a [employer] (serial number [id number]) right ventricular lead There were no immediate post procedure complications. 2.  CXR on 03/31/16 demonstrated no pneumothorax status post device implantation.   Brief HPI: SIRMICHAEL Duran is a 71 y.o. male was referred to electrophysiology in the outpatient setting for consideration of PPM implantation.  Past medical history includes high AV block, HTN, prostate cancer.  The patient has had symptomatic bradycardia without reversible causes identified.  Risks, benefits, and alternatives to PPM implantation were reviewed with the patient who wished to proceed.   Hospital Course:  The patient was admitted and underwent implantation of a PPM with details as outlined above. She was monitored on telemetry overnight which demonstrated SR, occ AV paced.  Left chest was without hematoma or ecchymosis.  The device was interrogated and found to be functioning normally.  CXR was obtained and demonstrated no pneumothorax status post device implantation.  Wound care, arm mobility, and restrictions were reviewed with the patient.  The patient was examined by Dr. Lovena Le and considered stable for discharge to home.    Physical Exam: Vitals:   03/30/16 1530 03/30/16 1600 03/30/16 2021  03/31/16 0409  BP: (!) 151/81 (!) 155/76 (!) 145/68 138/63  Pulse: 61 61 60 60  Resp: 16 18 18 18   Temp: 98.2 F (36.8 C) 98.3 F (36.8 C) 98.2 F (36.8 C) 98.2 F (36.8 C)  TempSrc:   Oral Oral  SpO2: 99% 99% 97% 97%  Weight:      Height:         GEN- The patient is well appearing, alert and oriented x 3 today.   HEENT: normocephalic, atraumatic; sclera clear, conjunctiva pink; hearing intact; oropharynx clear; neck supple, no JVP Lungs- CTA b/l, normal work of breathing.  No wheezes, rales, rhonchi Heart- RRR, no murmurs, rubs or gallops, PMI not laterally displaced GI- soft, non-tender, non-distended Extremities- no clubbing, cyanosis, or edema MS- no significant deformity or atrophy Skin- warm and dry, no rash or lesion, left chest without hematoma/ecchymosis Psych- euthymic mood, full affect Neuro- no gross deficits   Labs:   Lab Results  Component Value Date   WBC 8.3 03/28/2016   HCT 44.2 03/28/2016   MCV 87 03/28/2016   PLT 213 03/28/2016     Recent Labs Lab 03/28/16 1403  NA 147*  K 4.3  CL 107*  CO2 23  BUN 16  CREATININE 1.42*  CALCIUM 8.8  GLUCOSE 88    Discharge Medications:  Allergies as of 03/31/2016      Reactions   Morphine And Related Nausea And Vomiting      Medication List    TAKE these medications   lisinopril-hydrochlorothiazide 20-12.5 MG tablet Commonly known as:  PRINZIDE,ZESTORETIC Take 1 tablet by mouth daily.   naproxen sodium 220 MG tablet Commonly  known as:  ANAPROX Take 440 mg by mouth 2 (two) times daily as needed (pain).   ranitidine 150 MG tablet Commonly known as:  ZANTAC Take 150 mg by mouth daily as needed for heartburn.       Disposition:  Home Discharge Instructions    Diet - low sodium heart healthy    Complete by:  As directed    Increase activity slowly    Complete by:  As directed      Follow-up Information    [facility] Follow up on 04/13/2016.   Specialty:  Cardiology Why:   9:30AM, wound check Contact information: [address], [place] [phone]          Duration of Discharge Encounter: Greater than 30 minutes including physician time.  Venetia Night, PA-C 03/31/2016 10:08 AM  EP Attending  Patient seen and examined. Data reviewed. His PPM has been interogated under my direction. He is stable for DC home with usual followup.  [name].D.

## 2016-03-30 NOTE — Progress Notes (Signed)
Report and Care received from [facility]. Patient resting in bed comfortably at this time. Lt upper arm site WNL. Will continue to monitor patient.

## 2016-03-30 NOTE — Progress Notes (Signed)
Report and care transferred to [place]. Patient resting comfortably.

## 2016-03-30 NOTE — Interval H&P Note (Signed)
History and Physical Interval Note:  03/30/2016 9:34 AM  Phillip Duran  has presented today for surgery, with the diagnosis of second degree heart block  The various methods of treatment have been discussed with the patient and family. After consideration of risks, benefits and other options for treatment, the patient has consented to  Procedure(s): Pacemaker Implant (N/A) as a surgical intervention .  The patient's history has been reviewed, patient examined, no change in status, stable for surgery.  I have reviewed the patient's chart and labs.  Questions were answered to the patient's satisfaction.     Phillip Duran

## 2016-03-30 NOTE — Discharge Instructions (Signed)
° ° °  Supplemental Discharge Instructions for  Pacemaker/Defibrillator Patients  Activity No heavy lifting or vigorous activity with your left/right arm for 6 to 8 weeks.  Do not raise your left/right arm above your head for one week.  Gradually raise your affected arm as drawn below.             04/03/16                     04/04/16                    04/05/16                  04/06/16 __  NO DRIVING for  1week    ; you may begin driving on  [id number]   .  WOUND CARE - Keep the wound area clean and dry.  Do not get this area wet for one week. No showers for one week; you may shower on  04/06/16   . - The tape/steri-strips on your wound will fall off; do not pull them off.  No bandage is needed on the site.  DO  NOT apply any creams, oils, or ointments to the wound area. - If you notice any drainage or discharge from the wound, any swelling or bruising at the site, or you develop a fever > 101? F after you are discharged home, call the office at once.  Special Instructions - You are still able to use cellular telephones; use the ear opposite the side where you have your pacemaker/defibrillator.  Avoid carrying your cellular phone near your device. - When traveling through airports, show security personnel your identification card to avoid being screened in the metal detectors.  Ask the security personnel to use the hand wand. - Avoid arc welding equipment, MRI testing (magnetic resonance imaging), TENS units (transcutaneous nerve stimulators).  Call the office for questions about other devices. - Avoid electrical appliances that are in poor condition or are not properly grounded. - Microwave ovens are safe to be near or to operate.  Additional information for defibrillator patients should your device go off: - If your device goes off ONCE and you feel fine afterward, notify the device clinic nurses. - If your device goes off ONCE and you do not feel well afterward, call 911. - If your device goes  off TWICE, call 911. - If your device goes off THREE times in one day, call 911.  DO NOT DRIVE YOURSELF OR A FAMILY MEMBER WITH A DEFIBRILLATOR TO THE HOSPITAL--CALL 911.

## 2016-03-30 NOTE — H&P (View-Only) (Signed)
      HPI Phillip Duran is referred today by Dr. 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 He has had intermittent dyspnea and dizziness for several weeks. He has not had syncope. He has noted worsening sob but no chest pain. He went fishing this past weekend and felt like he might not be able to get his boat back on the trailer. He is on no medications to slow his heart. An ECG in his MD's office demonstrated 2:1 AV block. Allergies  Allergen Reactions  . Morphine And Related Nausea And Vomiting     Current Outpatient Prescriptions  Medication Sig Dispense Refill  . lisinopril-hydrochlorothiazide (PRINZIDE,ZESTORETIC) 20-12.5 MG tablet Take 1 tablet by mouth daily.     No current facility-administered medications for this visit.      Past Medical History:  Diagnosis Date  . Hypertension   . Prostate cancer ([place])     ROS:   All systems reviewed and negative except as noted in the HPI.   Past Surgical History:  Procedure Laterality Date  . BACK SURGERY    . NASAL SINUS SURGERY       Family History  Problem Relation Age of Onset  . Stroke Mother   . Hypertension Father   . Stroke Sister   . Stroke Sister      Social History   Social History  . Marital status: Married    Spouse name: N/A  . Number of children: N/A  . Years of education: N/A   Occupational History  . Not on file.   Social History Main Topics  . Smoking status: Never Smoker  . Smokeless tobacco: Never Used  . Alcohol use Yes  . Drug use: No  . Sexual activity: Not on file   Other Topics Concern  . Not on file   Social History Narrative  . No narrative on file     BP (!) 162/80   Pulse (!) 38   Ht 5\' 7"  (1.702 m)   Wt 152 lb (68.9 kg)   BMI 23.81 kg/m   Physical Exam:  Well appearing 71 yo man, NAD HEENT: Unremarkable Neck:  6 cm JVD, no thyromegally Lymphatics:  No adenopathy Back:  No CVA tenderness Lungs:  Clear with no wheezes HEART:  Regular brady rhythm, no  murmurs, no rubs, no clicks Abd:  soft, positive bowel sounds, no organomegally, no rebound, no guarding Ext:  2 plus pulses, no edema, no cyanosis, no clubbing Skin:  No rashes no nodules Neuro:  CN II through XII intact, motor grossly intact  EKG - from the outside office - NSR with 2:1 AV block and RBBB  Assess/Plan: 1. Symptomatic high grade (mobitz 2, second degree) AV block - I have discussed the risks/benefits/goals/expectations of PPM insertion with the patient and he wishes to proceed. 2. Dyspnea - I suspect that this is due to his heart block. However, will obtain a 2 D echo to be sure he has not developed LV dysfunction. 3. HTN - he will continue his diuretic/ACE combination.  [name].D.

## 2016-03-31 ENCOUNTER — Ambulatory Visit (HOSPITAL_COMMUNITY): Payer: Medicare Other

## 2016-03-31 DIAGNOSIS — I441 Atrioventricular block, second degree: Secondary | ICD-10-CM | POA: Diagnosis not present

## 2016-03-31 DIAGNOSIS — Z8546 Personal history of malignant neoplasm of prostate: Secondary | ICD-10-CM | POA: Diagnosis not present

## 2016-03-31 DIAGNOSIS — Z885 Allergy status to narcotic agent status: Secondary | ICD-10-CM | POA: Diagnosis not present

## 2016-03-31 DIAGNOSIS — I10 Essential (primary) hypertension: Secondary | ICD-10-CM | POA: Diagnosis not present

## 2016-03-31 NOTE — Progress Notes (Signed)
Order received to discharge patient.  Telemetry monitor removed and CCMD notified.  PIV access removed.  Discharge instructions, follow up, medications and instructions for their use were discussed with patient. 

## 2016-04-12 ENCOUNTER — Telehealth: Payer: Self-pay | Admitting: Internal Medicine

## 2016-04-12 NOTE — Telephone Encounter (Signed)
Pt c/o of Chest Pain: STAT if CP now or developed within 24 hours  1. Are you having CP right now? no 2. Are you experiencing any other symptoms (ex. SOB, nausea, vomiting, sweating)? headaches  3. How long have you been experiencing CP? 04-11-2016  4. Is your CP continuous or coming and going? going  5. Have you taken Nitroglycerin? no ?

## 2016-04-12 NOTE — Telephone Encounter (Signed)
Called, spoke with pt. Informed Dr. Lovena Le stated symptoms are likely not r/t device. Dr. Lovena Le stated to make sure to keep wound check appt tomorrow. Explain any concerns at that appt. Referred pt to PCP with further concerns or questions r/t headache or indigestion. Pt verbalized understanding.

## 2016-04-12 NOTE — Telephone Encounter (Signed)
Patient is complaining of a dull nagging headache and indigestion since yesterday. He denies any chest pain, dizziness, lightheadedness, or sweating. He states that it feels more like indigestion and that he has taken his zantac and his wife also gave him some pepcid. He states that he has taken aleve for his HA without relief. He states that his BP has been 135-170s/80s with a pulse of 70. He states that he is lisinopril-hydrochlorothiazide as directed. The patient has an appointment for a wound check with device tomorrow. Please advise if medication changes are needed or if he should follow up with PCP.

## 2016-04-13 ENCOUNTER — Ambulatory Visit (INDEPENDENT_AMBULATORY_CARE_PROVIDER_SITE_OTHER): Payer: Medicare Other | Admitting: *Deleted

## 2016-04-13 DIAGNOSIS — I441 Atrioventricular block, second degree: Secondary | ICD-10-CM

## 2016-04-13 LAB — CUP PACEART INCLINIC DEVICE CHECK
Implantable Lead Implant Date: 20180125
Implantable Lead Location: 753860
Implantable Lead Serial Number: 693139
Implantable Pulse Generator Implant Date: 20180125
Lead Channel Impedance Value: 710 Ohm
Lead Channel Pacing Threshold Amplitude: 1 V
Lead Channel Pacing Threshold Pulse Width: 0.4 ms
Lead Channel Pacing Threshold Pulse Width: 0.4 ms
Lead Channel Sensing Intrinsic Amplitude: 11.5 mV
Lead Channel Setting Sensing Sensitivity: 2.5 mV
MDC IDC LEAD IMPLANT DT: 20180125
MDC IDC LEAD LOCATION: 753859
MDC IDC LEAD SERIAL: 851154
MDC IDC MSMT LEADCHNL RA IMPEDANCE VALUE: 710 Ohm
MDC IDC MSMT LEADCHNL RA PACING THRESHOLD AMPLITUDE: 0.7 V
MDC IDC MSMT LEADCHNL RA SENSING INTR AMPL: 8.4 mV
MDC IDC SESS DTM: 20180208050000
MDC IDC SET LEADCHNL RA PACING AMPLITUDE: 3.5 V
MDC IDC SET LEADCHNL RV PACING AMPLITUDE: 3.5 V
MDC IDC SET LEADCHNL RV PACING PULSEWIDTH: 0.4 ms
Pulse Gen Serial Number: 773204

## 2016-04-13 NOTE — Progress Notes (Signed)
Wound check appointment. Steri-strips removed. Wound without redness or edema. Incision edges approximated, wound well healed. Normal device function. Thresholds, sensing, and impedances consistent with implant measurements. Device programmed at 3.5V for extra safety margin until 3 month visit. Histogram distribution appropriate for patient and level of activity. No mode switches or high ventricular rates noted. Patient educated about wound care, arm mobility, lifting restrictions. ROV in 3 months with GT. 

## 2016-05-10 ENCOUNTER — Telehealth: Payer: Self-pay | Admitting: Internal Medicine

## 2016-05-10 NOTE — Telephone Encounter (Signed)
Called, spoke with pt. Informed transmission received. Device Nurse reviewed report. Remote transmission looked good. No events captured. Informed I will forward information to Dr. Lovena Le to advise.

## 2016-05-10 NOTE — Telephone Encounter (Signed)
Manual Latitude transmission received.  No atrial or ventricular episodes noted, PPM function stable.  "PMT" episodes noted by device are false (ST at max track rate).  Presenting rhythm As/Vp.

## 2016-05-10 NOTE — Telephone Encounter (Signed)
Called, spoke with pt. PPM Implant 03/30/16. Pt informed he has been having increased indigestion/pressure under ribs and headaches for the past 3 weeks. Pt denied CP and SOB. Pt stated it is sore around his nipple. Pt stated he went to PCP. PCP informed symptoms not r/t pacemaker. Pt stated he started Nexium 2 days ago. Informed pt to send manual transmission. Will review and contact back.

## 2016-05-10 NOTE — Telephone Encounter (Signed)
New message    Pt is calling stating that he feels like he has indigestion under his rib cage. He says he has taken indigestion meds and it has not helped. No other symptoms, bp and hr are ok per pt.

## 2016-05-10 NOTE — Telephone Encounter (Signed)
New message   Pt is calling stating that he feels like he has indigestion under his rib cage. He says he has taken indigestion meds and it has not helped. No other symptoms, bp and hr are ok per pt.

## 2016-05-12 NOTE — Telephone Encounter (Signed)
He will need to be seen in the office by me. Order a CXR please.  GT

## 2016-05-18 NOTE — Telephone Encounter (Addendum)
Left message for patient with Dr Tanna Furry recommendations to get a CXR and get an OV if still having problems.  I have asked  he call back and let us know how he is doing so we can decide if an appointment needs to be made

## 2016-07-04 ENCOUNTER — Encounter (INDEPENDENT_AMBULATORY_CARE_PROVIDER_SITE_OTHER): Payer: Self-pay

## 2016-07-04 ENCOUNTER — Ambulatory Visit (INDEPENDENT_AMBULATORY_CARE_PROVIDER_SITE_OTHER): Payer: Medicare Other | Admitting: Internal Medicine

## 2016-07-04 ENCOUNTER — Encounter: Payer: Self-pay | Admitting: Internal Medicine

## 2016-07-04 VITALS — BP 110/72 | HR 82 | Ht 67.0 in | Wt 146.0 lb

## 2016-07-04 DIAGNOSIS — I441 Atrioventricular block, second degree: Secondary | ICD-10-CM | POA: Diagnosis not present

## 2016-07-04 NOTE — Progress Notes (Signed)
      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 He underwent PPM insertion about 3 months ago. He has done well in the interim. He denies syncope or chest pain or sob. His dizzy/vertigo spells have resolved. No limitation to activity at this point. Allergies  Allergen Reactions  . Morphine And Related Nausea And Vomiting     Current Outpatient Prescriptions  Medication Sig Dispense Refill  . lisinopril-hydrochlorothiazide (PRINZIDE,ZESTORETIC) 20-12.5 MG tablet Take 1 tablet by mouth daily.    . naproxen sodium (ANAPROX) 220 MG tablet Take 440 mg by mouth 2 (two) times daily as needed (pain).    . ranitidine (ZANTAC) 150 MG tablet Take 150 mg by mouth daily as needed for heartburn.     No current facility-administered medications for this visit.      Past Medical History:  Diagnosis Date  . Hypertension   . Prostate cancer ([place])     ROS:   All systems reviewed and negative except as noted in the HPI.   Past Surgical History:  Procedure Laterality Date  . BACK SURGERY    . EP IMPLANTABLE DEVICE N/A 03/30/2016   Procedure: Pacemaker Implant;  Surgeon: Evans Lance, MD;  Location: [facility];  Service: Cardiovascular;  Laterality: N/A;  . NASAL SINUS SURGERY       Family History  Problem Relation Age of Onset  . Stroke Mother   . Hypertension Father   . Stroke Sister   . Stroke Sister      Social History   Social History  . Marital status: Married    Spouse name: N/A  . Number of children: N/A  . Years of education: N/A   Occupational History  . Not on file.   Social History Main Topics  . Smoking status: Never Smoker  . Smokeless tobacco: Never Used  . Alcohol use Yes  . Drug use: No  . Sexual activity: Not on file   Other Topics Concern  . Not on file   Social History Narrative  . No narrative on file     BP 110/72 (BP Location: Left Arm)   Pulse 82   Ht 5\' 7"  (1.702 m)   Wt 146 lb (66.2 kg)    BMI 22.87 kg/m   Physical Exam:  Well appearing 71 yo man, NAD HEENT: Unremarkable Neck:  6 cm JVD, no thyromegally Lymphatics:  No adenopathy Back:  No CVA tenderness Lungs:  Clear with no wheezes HEART:  Regular brady rhythm, no murmurs, no rubs, no clicks Abd:  soft, positive bowel sounds, no organomegally, no rebound, no guarding Ext:  2 plus pulses, no edema, no cyanosis, no clubbing Skin:  No rashes no nodules Neuro:  CN II through XII intact, motor grossly intact  PPM interogation - Normal DDD PM function  Assess/Plan: 1. Symptomatic high grade (mobitz 2, second degree) AV block - He is s/p DDD PM insertion and is doing well. He will continue usual followup. 2. Dyspnea - I suspect that this was due to his heart block. He has normal LV function and his symptoms have essentially resolved since his PPM was inserted. 3. HTN - he will continue his diuretic/ACE combination. 4. PPM - his DDD [employer] PPM is working normally. Will recheck in several months.  Phillip Duran.D.

## 2016-07-04 NOTE — Patient Instructions (Signed)
Medication Instructions:  Your physician recommends that you continue on your current medications as directed. Please refer to the Current Medication list given to you today.   Labwork: none  Testing/Procedures: none  Follow-Up: Remote monitoring is used to monitor your Pacemaker of ICD from home. This monitoring reduces the number of office visits required to check your device to one time per year. It allows [place] to keep an eye on the functioning of your device to ensure it is working properly. You are scheduled for a device check from home on 10/03/16. You may send your transmission at any time that day. If you have a wireless device, the transmission will be sent automatically. After your physician reviews your transmission, you will receive a postcard with your next transmission date.  Your physician wants you to follow-up in: 9 months.  You will receive a reminder letter in the mail two months in advance. If you don't receive a letter, please call our office to schedule the follow-up appointment.   Any Other Special Instructions Will Be Listed Below (If Applicable).     If you need a refill on your cardiac medications before your next appointment, please call your pharmacy.

## 2016-07-18 LAB — CUP PACEART INCLINIC DEVICE CHECK
Battery Remaining Longevity: 144 mo
Date Time Interrogation Session: 20180502064100
Implantable Lead Implant Date: 20180125
Implantable Lead Implant Date: 20180125
Implantable Lead Location: 753859
Implantable Lead Serial Number: 693139
Implantable Pulse Generator Implant Date: 20180125
Lead Channel Impedance Value: 731 Ohm
Lead Channel Pacing Threshold Pulse Width: 0.4 ms
Lead Channel Pacing Threshold Pulse Width: 0.4 ms
Lead Channel Setting Pacing Pulse Width: 0.4 ms
Lead Channel Setting Sensing Sensitivity: 2.5 mV
MDC IDC LEAD LOCATION: 753860
MDC IDC LEAD SERIAL: 851154
MDC IDC MSMT BATTERY REMAINING PERCENTAGE: 100 %
MDC IDC MSMT LEADCHNL RA IMPEDANCE VALUE: 651 Ohm
MDC IDC MSMT LEADCHNL RA PACING THRESHOLD AMPLITUDE: 0.4 V
MDC IDC MSMT LEADCHNL RV PACING THRESHOLD AMPLITUDE: 0.8 V
MDC IDC SET LEADCHNL RA PACING AMPLITUDE: 2 V
MDC IDC SET LEADCHNL RV PACING AMPLITUDE: 2.5 V
MDC IDC STAT BRADY RA PERCENT PACED: 38 %
MDC IDC STAT BRADY RV PERCENT PACED: 100 %
Pulse Gen Serial Number: 773204

## 2016-10-03 ENCOUNTER — Encounter: Payer: Medicare Other | Admitting: *Deleted

## 2016-10-03 ENCOUNTER — Telehealth: Payer: Self-pay | Admitting: Cardiology

## 2016-10-03 NOTE — Telephone Encounter (Signed)
LMOVM reminding pt to send remote transmission.   

## 2016-10-06 ENCOUNTER — Encounter: Payer: Self-pay | Admitting: Cardiology

## 2016-12-04 ENCOUNTER — Ambulatory Visit (INDEPENDENT_AMBULATORY_CARE_PROVIDER_SITE_OTHER): Payer: Medicare Other | Admitting: *Deleted

## 2016-12-04 DIAGNOSIS — I441 Atrioventricular block, second degree: Secondary | ICD-10-CM

## 2016-12-05 LAB — CUP PACEART REMOTE DEVICE CHECK
Brady Statistic RV Percent Paced: 100 %
Date Time Interrogation Session: 20180930193100
Implantable Lead Implant Date: 20180125
Implantable Lead Location: 753860
Implantable Lead Model: 7740
Implantable Lead Model: 7741
Implantable Lead Serial Number: 693139
Implantable Pulse Generator Implant Date: 20180125
Lead Channel Impedance Value: 646 Ohm
Lead Channel Impedance Value: 730 Ohm
Lead Channel Pacing Threshold Amplitude: 0.7 V
Lead Channel Pacing Threshold Pulse Width: 0.4 ms
Lead Channel Setting Sensing Sensitivity: 2.5 mV
MDC IDC LEAD IMPLANT DT: 20180125
MDC IDC LEAD LOCATION: 753859
MDC IDC LEAD SERIAL: 851154
MDC IDC MSMT BATTERY REMAINING LONGEVITY: 168 mo
MDC IDC MSMT BATTERY REMAINING PERCENTAGE: 100 %
MDC IDC MSMT LEADCHNL RA PACING THRESHOLD AMPLITUDE: 0.4 V
MDC IDC MSMT LEADCHNL RA PACING THRESHOLD PULSEWIDTH: 0.4 ms
MDC IDC PG SERIAL: 773204
MDC IDC SET LEADCHNL RA PACING AMPLITUDE: 2 V
MDC IDC SET LEADCHNL RV PACING AMPLITUDE: 2.5 V
MDC IDC SET LEADCHNL RV PACING PULSEWIDTH: 0.4 ms
MDC IDC STAT BRADY RA PERCENT PACED: 23 %

## 2016-12-05 NOTE — Progress Notes (Signed)
Remote pacemaker transmission.   

## 2016-12-06 ENCOUNTER — Encounter: Payer: Self-pay | Admitting: Cardiology

## 2017-03-05 ENCOUNTER — Telehealth: Payer: Self-pay | Admitting: Cardiology

## 2017-03-05 ENCOUNTER — Ambulatory Visit (INDEPENDENT_AMBULATORY_CARE_PROVIDER_SITE_OTHER): Payer: Medicare Other | Admitting: *Deleted

## 2017-03-05 DIAGNOSIS — I441 Atrioventricular block, second degree: Secondary | ICD-10-CM | POA: Diagnosis not present

## 2017-03-05 NOTE — Telephone Encounter (Signed)
LMOVM reminding pt to send remote transmission.   

## 2017-03-07 NOTE — Progress Notes (Signed)
Remote pacemaker transmission.   

## 2017-03-08 LAB — CUP PACEART REMOTE DEVICE CHECK
Battery Remaining Percentage: 100 %
Brady Statistic RV Percent Paced: 100 %
Date Time Interrogation Session: 20181231213300
Implantable Lead Location: 753860
Implantable Lead Model: 7741
Implantable Lead Serial Number: 851154
Implantable Pulse Generator Implant Date: 20180125
Lead Channel Impedance Value: 664 Ohm
Lead Channel Impedance Value: 693 Ohm
Lead Channel Pacing Threshold Amplitude: 0.4 V
Lead Channel Pacing Threshold Amplitude: 0.8 V
Lead Channel Pacing Threshold Pulse Width: 0.4 ms
MDC IDC LEAD IMPLANT DT: 20180125
MDC IDC LEAD IMPLANT DT: 20180125
MDC IDC LEAD LOCATION: 753859
MDC IDC LEAD SERIAL: 693139
MDC IDC MSMT BATTERY REMAINING LONGEVITY: 168 mo
MDC IDC MSMT LEADCHNL RV PACING THRESHOLD PULSEWIDTH: 0.4 ms
MDC IDC SET LEADCHNL RA PACING AMPLITUDE: 2 V
MDC IDC SET LEADCHNL RV PACING AMPLITUDE: 2.5 V
MDC IDC SET LEADCHNL RV PACING PULSEWIDTH: 0.4 ms
MDC IDC SET LEADCHNL RV SENSING SENSITIVITY: 2.5 mV
MDC IDC STAT BRADY RA PERCENT PACED: 23 %
Pulse Gen Serial Number: 773204

## 2017-03-09 ENCOUNTER — Encounter: Payer: Self-pay | Admitting: Cardiology

## 2017-04-04 ENCOUNTER — Telehealth: Payer: Self-pay | Admitting: *Deleted

## 2017-04-04 NOTE — Telephone Encounter (Signed)
   [facility] Pre-operative Risk Assessment    Request for surgical clearance:  1. What type of surgery is being performed? Orthopaedic Procedure, Pre-Operative Consultation Request  2. When is this surgery scheduled? Pending Pre-Op Consultation and Pre-Op Clearance   3. What type of clearance is required (medical clearance vs. Pharmacy clearance to hold med vs. Both)? Pre-Op Consultation, Patient's BMI, HgA1C, Medical and Cardiac standpoint.   4. Are there any medications that need to be held prior to surgery and how long? Pending Cleaerance   5. Practice name and name of physician performing surgery? [facility] or [place]. Dr. Vickey Huger, An Affiliate of [facility]   6. What is your office phone and fax number? P# [phone], F# [phone]  7. Anesthesia type (None, local, MAC, general) ? Pending Approval, Not disclosed    Phillip Duran 04/04/2017, 10:10 AM  _________________________________________________________________   (provider comments below)

## 2017-04-04 NOTE — Telephone Encounter (Signed)
  Pre-operative Risk Assessment - Provider Statement    Patient ID:  Phillip Duran, DOB: 1946/01/13, MRN: [id number]   [name]'s chart has been reviewed for pre-operative risk assessment. Because of this patient's past medical history and time since last visit, a follow-up visit is required in order to better asses peri-operative cardiovascular risk. Pre-Op Covering Staff:  (1) Please schedule an appointment and notify patient.   (2) Please contact surgeon or referring provider via preferred method (phone, fax, etc) to notify them of the need for an office visit prior to clearance.    Signed,  Richardson Dopp, PA-C  04/04/2017 5:13 PM

## 2017-04-05 NOTE — Telephone Encounter (Signed)
Left a message for Phillip Duran that he would need an appt with Dr. Lovena Le or one of his APP's before he can be cleared.

## 2017-04-09 NOTE — Telephone Encounter (Signed)
Spoke with patient who confirmed that he has an appointment with Dr. Lovena Le for surgical clearance on 2/5.[name]

## 2017-04-10 ENCOUNTER — Encounter (INDEPENDENT_AMBULATORY_CARE_PROVIDER_SITE_OTHER): Payer: Self-pay

## 2017-04-10 ENCOUNTER — Ambulatory Visit: Payer: Medicare Other | Admitting: Internal Medicine

## 2017-04-10 ENCOUNTER — Encounter: Payer: Self-pay | Admitting: Internal Medicine

## 2017-04-10 VITALS — BP 140/90 | HR 82 | Ht 67.0 in | Wt 148.8 lb

## 2017-04-10 DIAGNOSIS — Z950 Presence of cardiac pacemaker: Secondary | ICD-10-CM

## 2017-04-10 DIAGNOSIS — I441 Atrioventricular block, second degree: Secondary | ICD-10-CM

## 2017-04-10 NOTE — Progress Notes (Signed)
HPI [name] returns today for followup and preoperative evaluation. He is a pleasant 72 yo man with a h/o HTN and 2:1 AV block, s/p PPM insertion. In the interim he has done well except for an injury to his right knee. He is pending knee replacement surgery. No chest pain or sob. No syncope. He is active despite his knee.  Allergies  Allergen Reactions  . Morphine And Related Nausea And Vomiting     No current outpatient medications on file.   No current facility-administered medications for this visit.      Past Medical History:  Diagnosis Date  . Hypertension   . Prostate cancer ([place])     ROS:   All systems reviewed and negative except as noted in the HPI.   Past Surgical History:  Procedure Laterality Date  . BACK SURGERY    . EP IMPLANTABLE DEVICE N/A 03/30/2016   Procedure: Pacemaker Implant;  Surgeon: Evans Lance, MD;  Location: [facility];  Service: Cardiovascular;  Laterality: N/A;  . NASAL SINUS SURGERY       Family History  Problem Relation Age of Onset  . Stroke Mother   . Hypertension Father   . Stroke Sister   . Stroke Sister      Social History   Socioeconomic History  . Marital status: Married    Spouse name: Not on file  . Number of children: Not on file  . Years of education: Not on file  . Highest education level: Not on file  Social Needs  . Financial resource strain: Not on file  . Food insecurity - worry: Not on file  . Food insecurity - inability: Not on file  . Transportation needs - medical: Not on file  . Transportation needs - non-medical: Not on file  Occupational History  . Not on file  Tobacco Use  . Smoking status: Never Smoker  . Smokeless tobacco: Never Used  Substance and Sexual Activity  . Alcohol use: Yes  . Drug use: No  . Sexual activity: Not on file  Other Topics Concern  . Not on file  Social History Narrative  . Not on file     BP 140/90   Pulse 82   Ht 5\' 7"  (1.702 m)   Wt 148 lb  12.8 oz (67.5 kg)   BMI 23.31 kg/m   Physical Exam:  Well appearing 72 yo man, NAD HEENT: Unremarkable Neck:  6 cm JVD, no thyromegally Lymphatics:  No adenopathy Back:  No CVA tenderness Lungs:  Clear with no wheezes HEART:  Regular rate rhythm, no murmurs, no rubs, no clicks Abd:  soft, positive bowel sounds, no organomegally, no rebound, no guarding Ext:  2 plus pulses, no edema, no cyanosis, no clubbing Skin:  No rashes no nodules Neuro:  CN II through XII intact, motor grossly intact  EKG - nsr with ventricular pacing  DEVICE  Normal device function.  See PaceArt for details.   Assess/Plan: 1. Preoperative evaluation - he is low risk for major cardiac complications from his pending knee replacement surgery. He may proceed. 2. HTN - his blood pressure is a little high today but he is in some pain. We will consider adjustment of his meds in the future if his pressure remains elevated once his knee pain is resolved. 3. Dizziness - he has had no symptoms since his PPM was placed.  4. Dyspnea - his dyspnea has resolved since his PPM was placed.  Phillip Duran.D.

## 2017-04-10 NOTE — Patient Instructions (Signed)
Medication Instructions:  Your physician recommends that you continue on your current medications as directed. Please refer to the Current Medication list given to you today.  Labwork: None ordered.  Testing/Procedures: None ordered.  Follow-Up: Your physician wants you to follow-up in: one year with Dr. Lovena Le.   You will receive a reminder letter in the mail two months in advance. If you don't receive a letter, please call our office to schedule the follow-up appointment.  Remote monitoring is used to monitor your ICD from home. This monitoring reduces the number of office visits required to check your device to one time per year. It allows [place] to keep an eye on the functioning of your device to ensure it is working properly. You are scheduled for a device check from home on 06/04/2017. You may send your transmission at any time that day. If you have a wireless device, the transmission will be sent automatically. After your physician reviews your transmission, you will receive a postcard with your next transmission date.  Any Other Special Instructions Will Be Listed Below (If Applicable).  If you need a refill on your cardiac medications before your next appointment, please call your pharmacy.

## 2017-04-23 ENCOUNTER — Other Ambulatory Visit: Payer: Self-pay | Admitting: Orthopedic Surgery

## 2017-04-30 LAB — CUP PACEART INCLINIC DEVICE CHECK
Implantable Lead Implant Date: 20180125
Implantable Lead Implant Date: 20180125
Implantable Lead Location: 753859
Implantable Lead Model: 7741
Implantable Lead Serial Number: 693139
Implantable Pulse Generator Implant Date: 20180125
Lead Channel Pacing Threshold Amplitude: 0.9 V
Lead Channel Pacing Threshold Pulse Width: 0.4 ms
Lead Channel Pacing Threshold Pulse Width: 0.4 ms
Lead Channel Sensing Intrinsic Amplitude: 18.3 mV
Lead Channel Setting Pacing Amplitude: 2 V
Lead Channel Setting Pacing Amplitude: 2.5 V
Lead Channel Setting Pacing Pulse Width: 0.4 ms
Lead Channel Setting Sensing Sensitivity: 2.5 mV
MDC IDC LEAD LOCATION: 753860
MDC IDC LEAD SERIAL: 851154
MDC IDC MSMT LEADCHNL RA IMPEDANCE VALUE: 644 Ohm
MDC IDC MSMT LEADCHNL RA PACING THRESHOLD AMPLITUDE: 0.4 V
MDC IDC MSMT LEADCHNL RA SENSING INTR AMPL: 9.3 mV
MDC IDC MSMT LEADCHNL RV IMPEDANCE VALUE: 653 Ohm
MDC IDC SESS DTM: 20190205050000
Pulse Gen Serial Number: 773204

## 2017-05-09 NOTE — Pre-Procedure Instructions (Signed)
Phillip Duran  05/09/2017      [facility] [place] [place] - [address] [place] [address] [place] [address] Phone: [phone] Fax: [phone]    Your procedure is scheduled on 05/21/2017.  Report to [facility] at 0900 A.M.  Call this number if you have problems the morning of surgery:  [phone]   Remember:  Do not eat food or drink liquids after midnight.   Continue all medications as directed by your physician except follow these medication instructions before surgery below   Take these medicines the morning of surgery with A SIP OF WATER: NONE  7 days prior to surgery STOP taking any Aspirin(unless otherwise instructed by your surgeon), Aleve, Naproxen, Ibuprofen, Motrin, Advil, Goody's, BC's, all herbal medications, fish oil, and all vitamins    Do not wear jewelry.  Do not wear lotions, powders, or colognes, or deodorant.  Men may shave face and neck.  Do not bring valuables to the hospital.  [facility] is not responsible for any belongings or valuables.  Hearing aids, eyeglasses, contacts, dentures or bridgework may not be worn into surgery.  Leave your suitcase in the car.  After surgery it may be brought to your room.  For patients admitted to the hospital, discharge time will be determined by your treatment team.  Patients discharged the day of surgery will not be allowed to drive home.   Name and phone number of your driver:    Special instructions:   [place]- Preparing For Surgery  Before surgery, you can play an important role. Because skin is not sterile, your skin needs to be as free of germs as possible. You can reduce the number of germs on your skin by washing with CHG (chlorahexidine gluconate) Soap before surgery.  CHG is an antiseptic cleaner which kills germs and bonds with the skin to continue killing germs even after washing.  Please do not use if you have an allergy to CHG or antibacterial  soaps. If your skin becomes reddened/irritated stop using the CHG.  Do not shave (including legs and underarms) for at least 48 hours prior to first CHG shower. It is OK to shave your face.  Please follow these instructions carefully.   1. Shower the NIGHT BEFORE SURGERY and the MORNING OF SURGERY with CHG.   2. If you chose to wash your hair, wash your hair first as usual with your normal shampoo.  3. After you shampoo, rinse your hair and body thoroughly to remove the shampoo.  4. Use CHG as you would any other liquid soap. You can apply CHG directly to the skin and wash gently with a scrungie or a clean washcloth.   5. Apply the CHG Soap to your body ONLY FROM THE NECK DOWN.  Do not use on open wounds or open sores. Avoid contact with your eyes, ears, mouth and genitals (private parts). Wash Face and genitals (private parts)  with your normal soap.  6. Wash thoroughly, paying special attention to the area where your surgery will be performed.  7. Thoroughly rinse your body with warm water from the neck down.  8. DO NOT shower/wash with your normal soap after using and rinsing off the CHG Soap.  9. Pat yourself dry with a CLEAN TOWEL.  10. Wear CLEAN PAJAMAS to bed the night before surgery, wear comfortable clothes the morning of surgery  11. Place CLEAN SHEETS on your bed  the night of your first shower and DO NOT SLEEP WITH PETS.    Day of Surgery: Shower as stated above. Do not apply any deodorants/lotions. Please wear clean clothes to the hospital/surgery center.      Please read over the following fact sheets that you were given.

## 2017-05-10 ENCOUNTER — Encounter (HOSPITAL_COMMUNITY): Payer: Self-pay

## 2017-05-10 ENCOUNTER — Encounter (HOSPITAL_COMMUNITY)
Admission: RE | Admit: 2017-05-10 | Discharge: 2017-05-10 | Disposition: A | Discharge status: Home / Self Care | Payer: Medicare Other | Source: Ambulatory Visit | Attending: Orthopedic Surgery | Admitting: Orthopedic Surgery

## 2017-05-10 ENCOUNTER — Other Ambulatory Visit: Payer: Self-pay

## 2017-05-10 DIAGNOSIS — Z01812 Encounter for preprocedural laboratory examination: Secondary | ICD-10-CM | POA: Insufficient documentation

## 2017-05-10 HISTORY — DX: Unspecified osteoarthritis, unspecified site: M19.90

## 2017-05-10 HISTORY — DX: Headache, unspecified: R51.9

## 2017-05-10 HISTORY — DX: Presence of cardiac pacemaker: Z95.0

## 2017-05-10 HISTORY — DX: Other complications of anesthesia, initial encounter: T88.59XA

## 2017-05-10 HISTORY — DX: Adverse effect of unspecified anesthetic, initial encounter: T41.45XA

## 2017-05-10 HISTORY — DX: Anxiety disorder, unspecified: F41.9

## 2017-05-10 HISTORY — DX: Cardiac arrhythmia, unspecified: I49.9

## 2017-05-10 HISTORY — DX: Depression, unspecified: F32.A

## 2017-05-10 HISTORY — DX: Diverticulitis of intestine, part unspecified, without perforation or abscess without bleeding: K57.92

## 2017-05-10 HISTORY — DX: Major depressive disorder, single episode, unspecified: F32.9

## 2017-05-10 HISTORY — DX: Headache: R51

## 2017-05-10 HISTORY — DX: Pneumonia, unspecified organism: J18.9

## 2017-05-10 LAB — SURGICAL PCR SCREEN
MRSA, PCR: NEGATIVE
Staphylococcus aureus: NEGATIVE

## 2017-05-10 LAB — BASIC METABOLIC PANEL
Anion gap: 9 (ref 5–15)
BUN: 16 mg/dL (ref 6–20)
CALCIUM: 9.3 mg/dL (ref 8.9–10.3)
CO2: 23 mmol/L (ref 22–32)
Chloride: 106 mmol/L (ref 101–111)
Creatinine, Ser: 1.38 mg/dL — ABNORMAL HIGH (ref 0.61–1.24)
GFR, EST AFRICAN AMERICAN: 58 mL/min — AB (ref >60)
GFR, EST NON AFRICAN AMERICAN: 50 mL/min — AB (ref >60)
Glucose, Bld: 106 mg/dL — ABNORMAL HIGH (ref 65–99)
Potassium: 4.9 mmol/L (ref 3.5–5.1)
SODIUM: 138 mmol/L (ref 135–145)

## 2017-05-10 LAB — CBC
HCT: 49 % (ref 39.0–52.0)
Hemoglobin: 16.5 g/dL (ref 13.0–17.0)
MCH: 29.5 pg (ref 26.0–34.0)
MCHC: 33.7 g/dL (ref 30.0–36.0)
MCV: 87.7 fL (ref 78.0–100.0)
Platelets: 212 10*3/uL (ref 150–400)
RBC: 5.59 MIL/uL (ref 4.22–5.81)
RDW: 13.9 % (ref 11.5–15.5)
WBC: 7.8 10*3/uL (ref 4.0–10.5)

## 2017-05-10 NOTE — Progress Notes (Signed)
PCP - Dr. Doree Fudge, [facility] Cardiologist/EP - Dr. Lovena Le; Cardiac Clearance by Dr. 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 Patient was also instructed that they will need to review over the PAT instructions again at home before surgery.

## 2017-05-18 MED ORDER — TRANEXAMIC ACID 1000 MG/10ML IV SOLN
1000.0000 mg | INTRAVENOUS | Status: AC
  Administered 2017-05-21: 1000 mg via INTRAVENOUS
  Filled 2017-05-18: qty 1100

## 2017-05-18 MED ORDER — BUPIVACAINE LIPOSOME 1.3 % IJ SUSP
20.0000 mL | INTRAMUSCULAR | Status: AC
  Administered 2017-05-21: 20 mL
  Filled 2017-05-18: qty 20

## 2017-05-21 ENCOUNTER — Ambulatory Visit (HOSPITAL_COMMUNITY): Payer: Medicare Other | Admitting: Anesthesiology

## 2017-05-21 ENCOUNTER — Encounter (HOSPITAL_COMMUNITY): Payer: Self-pay

## 2017-05-21 ENCOUNTER — Observation Stay (HOSPITAL_COMMUNITY)
Admission: RE | Admit: 2017-05-21 | Discharge: 2017-05-22 | Disposition: A | Discharge status: Home / Self Care | Payer: Medicare Other | Source: Ambulatory Visit | Attending: Orthopedic Surgery | Admitting: Orthopedic Surgery

## 2017-05-21 ENCOUNTER — Encounter (HOSPITAL_COMMUNITY)
Admission: RE | Disposition: A | Discharge status: Home / Self Care | Payer: Self-pay | Source: Ambulatory Visit | Attending: Orthopedic Surgery

## 2017-05-21 ENCOUNTER — Other Ambulatory Visit: Payer: Self-pay

## 2017-05-21 DIAGNOSIS — Z950 Presence of cardiac pacemaker: Secondary | ICD-10-CM | POA: Diagnosis not present

## 2017-05-21 DIAGNOSIS — I10 Essential (primary) hypertension: Secondary | ICD-10-CM | POA: Diagnosis not present

## 2017-05-21 DIAGNOSIS — M25761 Osteophyte, right knee: Secondary | ICD-10-CM | POA: Insufficient documentation

## 2017-05-21 DIAGNOSIS — Z96659 Presence of unspecified artificial knee joint: Secondary | ICD-10-CM

## 2017-05-21 DIAGNOSIS — M1711 Unilateral primary osteoarthritis, right knee: Principal | ICD-10-CM | POA: Insufficient documentation

## 2017-05-21 DIAGNOSIS — Z8546 Personal history of malignant neoplasm of prostate: Secondary | ICD-10-CM | POA: Insufficient documentation

## 2017-05-21 DIAGNOSIS — M25561 Pain in right knee: Secondary | ICD-10-CM | POA: Diagnosis present

## 2017-05-21 HISTORY — PX: TOTAL KNEE ARTHROPLASTY: SHX125

## 2017-05-21 SURGERY — ARTHROPLASTY, KNEE, TOTAL
Anesthesia: Spinal | Site: Knee | Laterality: Right

## 2017-05-21 MED ORDER — CEFAZOLIN SODIUM-DEXTROSE 2-4 GM/100ML-% IV SOLN
INTRAVENOUS | Status: AC
  Filled 2017-05-21: qty 100

## 2017-05-21 MED ORDER — GABAPENTIN 300 MG PO CAPS
300.0000 mg | ORAL_CAPSULE | Freq: Three times a day (TID) | ORAL | Status: DC
  Administered 2017-05-21 – 2017-05-22 (×3): 300 mg via ORAL
  Filled 2017-05-21 (×3): qty 1

## 2017-05-21 MED ORDER — DEXAMETHASONE SODIUM PHOSPHATE 10 MG/ML IJ SOLN
10.0000 mg | Freq: Once | INTRAMUSCULAR | Status: AC
  Administered 2017-05-22: 10 mg via INTRAVENOUS
  Filled 2017-05-21: qty 1

## 2017-05-21 MED ORDER — DEXAMETHASONE SODIUM PHOSPHATE 10 MG/ML IJ SOLN
8.0000 mg | Freq: Once | INTRAMUSCULAR | Status: AC
  Administered 2017-05-21: 8 mg via INTRAVENOUS

## 2017-05-21 MED ORDER — ACETAMINOPHEN 500 MG PO TABS
ORAL_TABLET | ORAL | Status: AC
  Administered 2017-05-21: 1000 mg via ORAL
  Filled 2017-05-21: qty 2

## 2017-05-21 MED ORDER — DOCUSATE SODIUM 100 MG PO CAPS
100.0000 mg | ORAL_CAPSULE | Freq: Two times a day (BID) | ORAL | Status: DC
  Administered 2017-05-21 – 2017-05-22 (×2): 100 mg via ORAL
  Filled 2017-05-21 (×2): qty 1

## 2017-05-21 MED ORDER — OXYCODONE HCL 5 MG PO TABS
5.0000 mg | ORAL_TABLET | ORAL | Status: DC | PRN
  Administered 2017-05-21 – 2017-05-22 (×2): 5 mg via ORAL
  Filled 2017-05-21: qty 1

## 2017-05-21 MED ORDER — METHOCARBAMOL 500 MG PO TABS
ORAL_TABLET | ORAL | Status: AC
  Filled 2017-05-21: qty 1

## 2017-05-21 MED ORDER — METHOCARBAMOL 500 MG PO TABS
500.0000 mg | ORAL_TABLET | Freq: Four times a day (QID) | ORAL | Status: DC | PRN
  Administered 2017-05-21: 500 mg via ORAL

## 2017-05-21 MED ORDER — ONDANSETRON HCL 4 MG/2ML IJ SOLN
INTRAMUSCULAR | Status: DC | PRN
  Administered 2017-05-21: 4 mg via INTRAVENOUS

## 2017-05-21 MED ORDER — MIDAZOLAM HCL 2 MG/2ML IJ SOLN
INTRAMUSCULAR | Status: AC
  Filled 2017-05-21: qty 2

## 2017-05-21 MED ORDER — FENTANYL CITRATE (PF) 100 MCG/2ML IJ SOLN: 25.0000 ug | INTRAMUSCULAR | Status: DC | PRN

## 2017-05-21 MED ORDER — CHLORHEXIDINE GLUCONATE 4 % EX LIQD: 60.0000 mL | Freq: Once | CUTANEOUS | Status: DC

## 2017-05-21 MED ORDER — ACETAMINOPHEN 500 MG PO TABS
1000.0000 mg | ORAL_TABLET | Freq: Once | ORAL | Status: AC
  Administered 2017-05-21: 1000 mg via ORAL

## 2017-05-21 MED ORDER — ACETAMINOPHEN 500 MG PO TABS
1000.0000 mg | ORAL_TABLET | Freq: Four times a day (QID) | ORAL | Status: AC
  Administered 2017-05-21 – 2017-05-22 (×4): 1000 mg via ORAL
  Filled 2017-05-21 (×4): qty 2

## 2017-05-21 MED ORDER — PHENOL 1.4 % MT LIQD: 1.0000 | OROMUCOSAL | Status: DC | PRN

## 2017-05-21 MED ORDER — PROPOFOL 10 MG/ML IV BOLUS
INTRAVENOUS | Status: AC
  Filled 2017-05-21: qty 20

## 2017-05-21 MED ORDER — ONDANSETRON HCL 4 MG/2ML IJ SOLN
4.0000 mg | Freq: Four times a day (QID) | INTRAMUSCULAR | Status: DC | PRN
  Administered 2017-05-21: 4 mg via INTRAVENOUS
  Filled 2017-05-21: qty 2

## 2017-05-21 MED ORDER — MENTHOL 3 MG MT LOZG: 1.0000 | LOZENGE | OROMUCOSAL | Status: DC | PRN

## 2017-05-21 MED ORDER — BUPIVACAINE-EPINEPHRINE (PF) 0.25% -1:200000 IJ SOLN
INTRAMUSCULAR | Status: AC
  Filled 2017-05-21: qty 30

## 2017-05-21 MED ORDER — SODIUM CHLORIDE 0.9 % IR SOLN
Status: DC | PRN
  Administered 2017-05-21: 1000 mL

## 2017-05-21 MED ORDER — METOCLOPRAMIDE HCL 5 MG/ML IJ SOLN
5.0000 mg | Freq: Three times a day (TID) | INTRAMUSCULAR | Status: DC | PRN
  Administered 2017-05-21: 10 mg via INTRAVENOUS

## 2017-05-21 MED ORDER — OXYCODONE HCL 5 MG PO TABS
ORAL_TABLET | ORAL | Status: AC
  Filled 2017-05-21: qty 1

## 2017-05-21 MED ORDER — ONDANSETRON HCL 4 MG/2ML IJ SOLN
INTRAMUSCULAR | Status: AC
  Filled 2017-05-21: qty 6

## 2017-05-21 MED ORDER — DEXAMETHASONE SODIUM PHOSPHATE 10 MG/ML IJ SOLN
INTRAMUSCULAR | Status: AC
  Filled 2017-05-21: qty 1

## 2017-05-21 MED ORDER — BUPIVACAINE IN DEXTROSE 0.75-8.25 % IT SOLN
INTRATHECAL | Status: DC | PRN
  Administered 2017-05-21: 13.5 mg via INTRATHECAL

## 2017-05-21 MED ORDER — HYDROMORPHONE HCL 1 MG/ML IJ SOLN: 0.5000 mg | INTRAMUSCULAR | Status: DC | PRN

## 2017-05-21 MED ORDER — SENNOSIDES-DOCUSATE SODIUM 8.6-50 MG PO TABS: 1.0000 | ORAL_TABLET | Freq: Every evening | ORAL | Status: DC | PRN

## 2017-05-21 MED ORDER — METOCLOPRAMIDE HCL 5 MG PO TABS: 5.0000 mg | ORAL_TABLET | Freq: Three times a day (TID) | ORAL | Status: DC | PRN

## 2017-05-21 MED ORDER — PROPOFOL 500 MG/50ML IV EMUL
INTRAVENOUS | Status: DC | PRN
  Administered 2017-05-21: 75 ug/kg/min via INTRAVENOUS

## 2017-05-21 MED ORDER — FLEET ENEMA 7-19 GM/118ML RE ENEM: 1.0000 | ENEMA | Freq: Once | RECTAL | Status: DC | PRN

## 2017-05-21 MED ORDER — PANTOPRAZOLE SODIUM 40 MG PO TBEC
40.0000 mg | DELAYED_RELEASE_TABLET | Freq: Every day | ORAL | Status: DC
  Administered 2017-05-22: 40 mg via ORAL
  Filled 2017-05-21: qty 1

## 2017-05-21 MED ORDER — SODIUM CHLORIDE 0.9 % IJ SOLN
INTRAMUSCULAR | Status: DC | PRN
  Administered 2017-05-21: 20 mL

## 2017-05-21 MED ORDER — DEXTROSE 5 % IV SOLN
500.0000 mg | Freq: Four times a day (QID) | INTRAVENOUS | Status: DC | PRN
  Filled 2017-05-21: qty 5

## 2017-05-21 MED ORDER — MIDAZOLAM HCL 5 MG/5ML IJ SOLN
INTRAMUSCULAR | Status: DC | PRN
  Administered 2017-05-21 (×2): 1 mg via INTRAVENOUS

## 2017-05-21 MED ORDER — PHENYLEPHRINE HCL 10 MG/ML IJ SOLN
INTRAMUSCULAR | Status: AC
  Filled 2017-05-21: qty 1

## 2017-05-21 MED ORDER — FENTANYL CITRATE (PF) 100 MCG/2ML IJ SOLN
INTRAMUSCULAR | Status: AC
  Administered 2017-05-21: 100 ug via INTRAVENOUS
  Filled 2017-05-21: qty 2

## 2017-05-21 MED ORDER — ZOLPIDEM TARTRATE 5 MG PO TABS: 5.0000 mg | ORAL_TABLET | Freq: Every evening | ORAL | Status: DC | PRN

## 2017-05-21 MED ORDER — ALUM & MAG HYDROXIDE-SIMETH 200-200-20 MG/5ML PO SUSP
30.0000 mL | ORAL | Status: DC | PRN
  Administered 2017-05-21: 30 mL via ORAL
  Filled 2017-05-21: qty 30

## 2017-05-21 MED ORDER — GABAPENTIN 300 MG PO CAPS
ORAL_CAPSULE | ORAL | Status: AC
  Administered 2017-05-21: 300 mg via ORAL
  Filled 2017-05-21: qty 1

## 2017-05-21 MED ORDER — CEFAZOLIN SODIUM-DEXTROSE 2-4 GM/100ML-% IV SOLN
2.0000 g | INTRAVENOUS | Status: AC
Start: 2017-05-21 — End: 2017-05-21
  Administered 2017-05-21: 2 g via INTRAVENOUS

## 2017-05-21 MED ORDER — LACTATED RINGERS IV SOLN
INTRAVENOUS | Status: DC
  Administered 2017-05-21: 10:00:00 via INTRAVENOUS

## 2017-05-21 MED ORDER — TRANEXAMIC ACID 1000 MG/10ML IV SOLN
1000.0000 mg | Freq: Once | INTRAVENOUS | Status: DC
  Filled 2017-05-21: qty 10

## 2017-05-21 MED ORDER — ONDANSETRON HCL 4 MG PO TABS
4.0000 mg | ORAL_TABLET | Freq: Four times a day (QID) | ORAL | Status: DC | PRN
Start: 2017-05-21 — End: 2017-05-22

## 2017-05-21 MED ORDER — DEXTROSE 5 % IV SOLN
INTRAVENOUS | Status: DC | PRN
  Administered 2017-05-21: 20 ug/min via INTRAVENOUS

## 2017-05-21 MED ORDER — CEFAZOLIN SODIUM-DEXTROSE 2-4 GM/100ML-% IV SOLN
2.0000 g | Freq: Four times a day (QID) | INTRAVENOUS | Status: AC
  Administered 2017-05-21 (×2): 2 g via INTRAVENOUS
  Filled 2017-05-21 (×2): qty 100

## 2017-05-21 MED ORDER — ROCURONIUM BROMIDE 10 MG/ML (PF) SYRINGE
PREFILLED_SYRINGE | INTRAVENOUS | Status: AC
  Filled 2017-05-21: qty 5

## 2017-05-21 MED ORDER — GABAPENTIN 300 MG PO CAPS
300.0000 mg | ORAL_CAPSULE | Freq: Once | ORAL | Status: AC
  Administered 2017-05-21: 300 mg via ORAL

## 2017-05-21 MED ORDER — LIDOCAINE 2% (20 MG/ML) 5 ML SYRINGE
INTRAMUSCULAR | Status: DC | PRN
  Administered 2017-05-21: 60 mg via INTRAVENOUS

## 2017-05-21 MED ORDER — BISACODYL 5 MG PO TBEC: 5.0000 mg | DELAYED_RELEASE_TABLET | Freq: Every day | ORAL | Status: DC | PRN

## 2017-05-21 MED ORDER — LIDOCAINE HCL (CARDIAC) 20 MG/ML IV SOLN
INTRAVENOUS | Status: AC
  Filled 2017-05-21: qty 15

## 2017-05-21 MED ORDER — DIPHENHYDRAMINE HCL 12.5 MG/5ML PO ELIX: 12.5000 mg | ORAL_SOLUTION | ORAL | Status: DC | PRN

## 2017-05-21 MED ORDER — ASPIRIN EC 325 MG PO TBEC
325.0000 mg | DELAYED_RELEASE_TABLET | Freq: Two times a day (BID) | ORAL | Status: DC
  Administered 2017-05-21 – 2017-05-22 (×2): 325 mg via ORAL
  Filled 2017-05-21 (×2): qty 1

## 2017-05-21 MED ORDER — BUPIVACAINE-EPINEPHRINE (PF) 0.25% -1:200000 IJ SOLN
INTRAMUSCULAR | Status: DC | PRN
  Administered 2017-05-21: 30 mL

## 2017-05-21 MED ORDER — FENTANYL CITRATE (PF) 100 MCG/2ML IJ SOLN
100.0000 ug | Freq: Once | INTRAMUSCULAR | Status: AC
  Administered 2017-05-21: 100 ug via INTRAVENOUS

## 2017-05-21 MED ORDER — ROPIVACAINE HCL 7.5 MG/ML IJ SOLN
INTRAMUSCULAR | Status: DC | PRN
  Administered 2017-05-21: 20 mL via PERINEURAL

## 2017-05-21 SURGICAL SUPPLY — 67 items
ARTISURF 10M PLY R 6-9EF KNEE (Knees) ×1 IMPLANT
BANDAGE ACE 6X5 VEL STRL LF (GAUZE/BANDAGES/DRESSINGS) ×2 IMPLANT
BANDAGE ESMARK 6X9 LF (GAUZE/BANDAGES/DRESSINGS) ×1 IMPLANT
BLADE SAGITTAL 13X1.27X60 (BLADE) ×2 IMPLANT
BLADE SAW SGTL 83.5X18.5 (BLADE) ×2 IMPLANT
BLADE SURG 10 STRL SS (BLADE) ×2 IMPLANT
BNDG CMPR 9X6 STRL LF SNTH (GAUZE/BANDAGES/DRESSINGS) ×1
BNDG ESMARK 6X9 LF (GAUZE/BANDAGES/DRESSINGS) ×2
BOWL SMART MIX CTS (DISPOSABLE) ×2 IMPLANT
BSPLAT TIB 5D E CMNT KN RT (Knees) ×1 IMPLANT
CEMENT BONE SIMPLEX SPEEDSET (Cement) ×4 IMPLANT
CLOSURE STERI-STRIP 1/4X4 (GAUZE/BANDAGES/DRESSINGS) ×1 IMPLANT
COVER SURGICAL LIGHT HANDLE (MISCELLANEOUS) ×2 IMPLANT
CUFF TOURNIQUET SINGLE 34IN LL (TOURNIQUET CUFF) ×2 IMPLANT
DRAPE EXTREMITY T 121X128X90 (DRAPE) ×2 IMPLANT
DRAPE HALF SHEET 40X57 (DRAPES) ×2 IMPLANT
DRAPE INCISE IOBAN 66X45 STRL (DRAPES) ×4 IMPLANT
DRAPE U-SHAPE 47X51 STRL (DRAPES) ×2 IMPLANT
DRSG AQUACEL AG ADV 3.5X10 (GAUZE/BANDAGES/DRESSINGS) ×2 IMPLANT
DURAPREP 26ML APPLICATOR (WOUND CARE) ×4 IMPLANT
ELECT REM PT RETURN 9FT ADLT (ELECTROSURGICAL) ×2
ELECTRODE REM PT RTRN 9FT ADLT (ELECTROSURGICAL) ×1 IMPLANT
FEMUR  CMT CCR STD SZ8 R KNEE (Knees) ×1 IMPLANT
FEMUR CMT CCR STD SZ8 R KNEE (Knees) ×1 IMPLANT
FEMUR CMTD CCR STD SZ8 R KNEE (Knees) IMPLANT
GLOVE BIOGEL M 7.0 STRL (GLOVE) IMPLANT
GLOVE BIOGEL PI IND STRL 7.5 (GLOVE) IMPLANT
GLOVE BIOGEL PI IND STRL 8.5 (GLOVE) ×1 IMPLANT
GLOVE BIOGEL PI INDICATOR 7.5 (GLOVE)
GLOVE BIOGEL PI INDICATOR 8.5 (GLOVE) ×1
GLOVE SURG ORTHO 8.0 STRL STRW (GLOVE) ×4 IMPLANT
GOWN STRL REUS W/ TWL LRG LVL3 (GOWN DISPOSABLE) ×1 IMPLANT
GOWN STRL REUS W/ TWL XL LVL3 (GOWN DISPOSABLE) ×2 IMPLANT
GOWN STRL REUS W/TWL 2XL LVL3 (GOWN DISPOSABLE) ×2 IMPLANT
GOWN STRL REUS W/TWL LRG LVL3 (GOWN DISPOSABLE) ×2
GOWN STRL REUS W/TWL XL LVL3 (GOWN DISPOSABLE) ×4
HANDPIECE INTERPULSE COAX TIP (DISPOSABLE) ×2
HOOD PEEL AWAY FACE SHEILD DIS (HOOD) ×6 IMPLANT
KIT BASIN OR (CUSTOM PROCEDURE TRAY) ×2 IMPLANT
KIT ROOM TURNOVER OR (KITS) ×2 IMPLANT
MANIFOLD NEPTUNE II (INSTRUMENTS) ×2 IMPLANT
NDL 18GX1X1/2 (RX/OR ONLY) (NEEDLE) IMPLANT
NEEDLE 18GX1X1/2 (RX/OR ONLY) (NEEDLE) IMPLANT
NEEDLE 22X1 1/2 (OR ONLY) (NEEDLE) ×4 IMPLANT
NS IRRIG 1000ML POUR BTL (IV SOLUTION) ×2 IMPLANT
PACK TOTAL JOINT (CUSTOM PROCEDURE TRAY) ×2 IMPLANT
PAD ARMBOARD 7.5X6 YLW CONV (MISCELLANEOUS) ×4 IMPLANT
SET HNDPC FAN SPRY TIP SCT (DISPOSABLE) ×1 IMPLANT
STEM POLY PAT PLY 32M KNEE (Knees) ×1 IMPLANT
STEM TIBIA 5 DEG SZ E R KNEE (Knees) IMPLANT
STRIP CLOSURE SKIN 1/2X4 (GAUZE/BANDAGES/DRESSINGS) ×2 IMPLANT
SUCTION FRAZIER HANDLE 10FR (MISCELLANEOUS)
SUCTION TUBE FRAZIER 10FR DISP (MISCELLANEOUS) IMPLANT
SUT BONE WAX W31G (SUTURE) ×2 IMPLANT
SUT MNCRL AB 3-0 PS2 18 (SUTURE) ×2 IMPLANT
SUT VIC AB 0 CTB1 27 (SUTURE) ×2 IMPLANT
SUT VIC AB 1 CT1 27 (SUTURE) ×4
SUT VIC AB 1 CT1 27XBRD ANBCTR (SUTURE) ×2 IMPLANT
SUT VIC AB 2-0 CT1 27 (SUTURE) ×4
SUT VIC AB 2-0 CT1 TAPERPNT 27 (SUTURE) ×2 IMPLANT
SUT VLOC 180 0 24IN GS25 (SUTURE) ×2 IMPLANT
SYR 20CC LL (SYRINGE) ×4 IMPLANT
TIBIA STEM 5 DEG SZ E R KNEE (Knees) ×2 IMPLANT
TOWEL OR 17X24 6PK STRL BLUE (TOWEL DISPOSABLE) ×2 IMPLANT
TOWEL OR 17X26 10 PK STRL BLUE (TOWEL DISPOSABLE) ×2 IMPLANT
TRAY CATH 16FR W/PLASTIC CATH (SET/KITS/TRAYS/PACK) IMPLANT
WRAP KNEE MAXI GEL POST OP (GAUZE/BANDAGES/DRESSINGS) ×2 IMPLANT

## 2017-05-21 NOTE — Anesthesia Procedure Notes (Signed)
Spinal  Patient location during procedure: OR Start time: 05/21/2017 11:45 AM End time: 05/21/2017 11:50 AM Staffing Anesthesiologist: Roderic Palau, MD Performed: anesthesiologist  Preanesthetic Checklist Completed: patient identified, surgical consent, pre-op evaluation, timeout performed, IV checked, risks and benefits discussed and monitors and equipment checked Spinal Block Patient position: sitting Prep: DuraPrep Patient monitoring: cardiac monitor, continuous pulse ox and blood pressure Approach: midline Location: L3-4 Injection technique: single-shot Needle Needle type: Pencan  Needle gauge: 24 G Needle length: 9 cm Assessment Sensory level: T8 Additional Notes Functioning IV was confirmed and monitors were applied. Sterile prep and drape, including hand hygiene and sterile gloves were used. The patient was positioned and the spine was prepped. The skin was anesthetized with lidocaine.  Free flow of clear CSF was obtained prior to injecting local anesthetic into the CSF.  The spinal needle aspirated freely following injection.  The needle was carefully withdrawn.  The patient tolerated the procedure well.

## 2017-05-21 NOTE — Progress Notes (Signed)
Orthopedic Tech Progress Note Patient Details:  Phillip Duran 1945/11/14 [id number]  CPM Right Knee CPM Right Knee: On Right Knee Flexion (Degrees): 90 Right Knee Extension (Degrees): 0 Additional Comments: foot roll  Post Interventions Patient Tolerated: Well Instructions Provided: Care of device, Adjustment of device  [name] 05/21/2017, 1:55 PM

## 2017-05-21 NOTE — Evaluation (Signed)
Physical Therapy Evaluation Patient Details Name: Phillip Duran MRN: [id number] DOB: [birth year] Today's Date: 05/21/2017   History of Present Illness  pt is a 72 y/o male with pmh significant for prostate CA, Pacer, HTN admitted for elective R TKA  Clinical Impression  Pt admitted with/for elective R TKA.  Pt currently limited functionally due to the problems listed below.  (see problems list.)  Pt will benefit from PT to maximize function and safety to be able to get home safely with available assist 05/21/2017  [facility], PT [phone]  (pager).     Follow Up Recommendations Follow surgeon's recommendation for DC plan and follow-up therapies    Equipment Recommendations  (TBA)    Recommendations for Other Services       Precautions / Restrictions Precautions Precautions: Knee Precaution Booklet Issued: Yes (comment) Restrictions Weight Bearing Restrictions: Yes RLE Weight Bearing: Weight bearing as tolerated      Mobility  Bed Mobility Overal bed mobility: Modified Independent                Transfers Overall transfer level: Needs assistance   Transfers: Sit to/from Stand Sit to Stand: Min guard         General transfer comment: cueas for safe technique  Ambulation/Gait Ambulation/Gait assistance: Min assist Ambulation Distance (Feet): 50 Feet Assistive device: Rolling walker (2 wheeled) Gait Pattern/deviations: Step-to pattern   Gait velocity interpretation: Below normal speed for age/gender General Gait Details: unsteady gait due to R LE numbness, though pt could control for knee extension  Stairs            Wheelchair Mobility    Modified Rankin (Stroke Patients Only)       Balance Overall balance assessment: No apparent balance deficits (not formally assessed)                                           Pertinent Vitals/Pain Pain Assessment: Faces Faces Pain Scale: Hurts a little bit Pain  Location: knee Pain Descriptors / Indicators: Discomfort;Numbness Pain Intervention(s): Monitored during session    Home Living Family/patient expects to be discharged to:: Private residence Living Arrangements: Spouse/significant other;Other relatives Available Help at Discharge: Family;Available 24 hours/day Type of Home: House Home Access: Stairs to enter Entrance Stairs-Rails: Psychiatric nurse of Steps: 4 Home Layout: One level        Prior Function Level of Independence: Independent               Hand Dominance        Extremity/Trunk Assessment   Upper Extremity Assessment Upper Extremity Assessment: Defer to OT evaluation    Lower Extremity Assessment Lower Extremity Assessment: Overall WFL for tasks assessed(limited due to spinal)       Communication   Communication: No difficulties  Cognition Arousal/Alertness: Awake/alert Behavior During Therapy: WFL for tasks assessed/performed Overall Cognitive Status: Within Functional Limits for tasks assessed                                        General Comments General comments (skin integrity, edema, etc.): pt nauseated and vomiting throughout session    Exercises Total Joint Exercises Ankle Circles/Pumps: AROM;20 reps;Supine Quad Sets: AROM;Both;10 reps;Supine Gluteal Sets: AROM;Both;10 reps;Supine Short Arc Quad: AROM;Right;10 reps;Supine Heel Slides: AROM;Right;10 reps;Supine Straight  Leg Raises: AROM;Right;10 reps;Supine Goniometric ROM: 85 in supine active   Assessment/Plan    PT Assessment Patient needs continued PT services  PT Problem List Decreased range of motion;Decreased activity tolerance;Decreased mobility;Decreased knowledge of use of DME;Decreased knowledge of precautions;Pain       PT Treatment Interventions DME instruction;Gait training;Stair training;Functional mobility training;Therapeutic activities;Patient/family education    PT Goals  (Current goals can be found in the Care Plan section)  Acute Rehab PT Goals Patient Stated Goal: home active and independent PT Goal Formulation: With patient Time For Goal Achievement: 05/28/17 Potential to Achieve Goals: Good    Frequency 7X/week   Barriers to discharge        Co-evaluation               AM-PAC PT "6 Clicks" Daily Activity  Outcome Measure Difficulty turning over in bed (including adjusting bedclothes, sheets and blankets)?: None Difficulty moving from lying on back to sitting on the side of the bed? : None Difficulty sitting down on and standing up from a chair with arms (e.g., wheelchair, bedside commode, etc,.)?: None Help needed moving to and from a bed to chair (including a wheelchair)?: A Little Help needed walking in hospital room?: A Little Help needed climbing 3-5 steps with a railing? : A Little 6 Click Score: 21    End of Session   Activity Tolerance: Patient tolerated treatment well Patient left: in bed;with call bell/phone within reach;with family/visitor present Nurse Communication: Mobility status PT Visit Diagnosis: Other abnormalities of gait and mobility (R26.89);Pain Pain - Right/Left: Right Pain - part of body: Knee    Time: 1800-1839 PT Time Calculation (min) (ACUTE ONLY): 39 min   Charges:   PT Evaluation $PT Eval Low Complexity: 1 Low PT Treatments $Gait Training: 8-22 mins $Therapeutic Exercise: 8-22 mins   PT G Codes:        May 25, 2017  Donnella Sham, PT [phone]  (pager)  [name] 05/25/2017, 6:54 PM

## 2017-05-21 NOTE — H&P (Signed)
Phillip Duran MRN:  [id number] DOB/SEX:  1945/08/10/male  CHIEF COMPLAINT:  Painful right Knee  HISTORY: Patient is a 72 y.o. male presented with a history of pain in the right knee. Onset of symptoms was gradual starting a few years ago with gradually worsening course since that time. Patient has been treated conservatively with over-the-counter NSAIDs and activity modification. Patient currently rates pain in the knee at 10 out of 10 with activity. There is pain at night.  PAST MEDICAL HISTORY: Patient Active Problem List   Diagnosis Date Noted  . Mobitz type 2 second degree AV block 03/30/2016  . Mobitz type 2 second degree atrioventricular block 03/30/2016   Past Medical History:  Diagnosis Date  . Anxiety   . Arthritis   . Complication of anesthesia    "hard time waking up"  . Depression   . Diverticulitis   . Dysrhythmia    2:1 AV Block - pacemaker insertion by Dr. Lovena Le   . Headache   . Hypertension   . Pneumonia    aspiration pneumonia  . Presence of permanent cardiac pacemaker    managed by Dr. Lovena Le, [facility]  . Prostate cancer [facility])    Past Surgical History:  Procedure Laterality Date  . APPENDECTOMY    . BACK SURGERY    . CARDIAC CATHETERIZATION     in [place], [place] in the 1990's  . EP IMPLANTABLE DEVICE N/A 03/30/2016   Procedure: Pacemaker Implant;  Surgeon: Evans Lance, MD;  Location: [place] [facility];  Service: Cardiovascular;  Laterality: N/A;  . HERNIA REPAIR     x3  . KNEE ARTHROSCOPY Bilateral   . NASAL SINUS SURGERY     eye patch on left side  . VASECTOMY       MEDICATIONS:   No medications prior to admission.    ALLERGIES:   Allergies  Allergen Reactions  . Morphine And Related Nausea And Vomiting  . Other Other (See Comments)    Anesthesia--difficulty waking patient    REVIEW OF SYSTEMS:  A comprehensive review of systems was negative except for: Musculoskeletal: positive for arthralgias and bone pain   FAMILY  HISTORY:   Family History  Problem Relation Age of Onset  . Stroke Mother   . Hypertension Father   . Stroke Sister   . Stroke Sister     SOCIAL HISTORY:   Social History   Tobacco Use  . Smoking status: Never Smoker  . Smokeless tobacco: Never Used  Substance Use Topics  . Alcohol use: Yes    Alcohol/week: 1.8 oz    Types: 3 Cans of beer per week     EXAMINATION:  Vital signs in last 24 hours: Temp:  [97.5 F (36.4 C)] 97.5 F (36.4 C) (03/18 0911) Pulse Rate:  [60] 60 (03/18 0911) Resp:  [20] 20 (03/18 0911) BP: (153)/(82) 153/82 (03/18 0911) SpO2:  [100 %] 100 % (03/18 0911)  BP (!) 153/82   Pulse 60   Temp (!) 97.5 F (36.4 C) (Oral)   Resp 20   SpO2 100%   General Appearance:    Alert, cooperative, no distress, appears stated age  Head:    Normocephalic, without obvious abnormality, atraumatic  Eyes:    PERRL, conjunctiva/corneas clear, EOM's intact, fundi    benign, both eyes       Ears:    Normal TM's and external ear canals, both ears  Nose:   Nares normal, septum midline, mucosa normal, no drainage  or sinus tenderness  Throat:   Lips, mucosa, and tongue normal; teeth and gums normal  Neck:   Supple, symmetrical, trachea midline, no adenopathy;       thyroid:  No enlargement/tenderness/nodules; no carotid   bruit or JVD  Back:     Symmetric, no curvature, ROM normal, no CVA tenderness  Lungs:     Clear to auscultation bilaterally, respirations unlabored  Chest wall:    No tenderness or deformity  Heart:    Regular rate and rhythm, S1 and S2 normal, no murmur, rub   or gallop  Abdomen:     Soft, non-tender, bowel sounds active all four quadrants,    no masses, no organomegaly  Genitalia:    Normal male without lesion, discharge or tenderness  Rectal:    Normal tone, normal prostate, no masses or tenderness;   guaiac negative stool  Extremities:   Extremities normal, atraumatic, no cyanosis or edema  Pulses:   2+ and symmetric all extremities   Skin:   Skin color, texture, turgor normal, no rashes or lesions  Lymph nodes:   Cervical, supraclavicular, and axillary nodes normal  Neurologic:   CNII-XII intact. Normal strength, sensation and reflexes      throughout    Musculoskeletal:  ROM 0-120, Ligaments intact,  Imaging Review Plain radiographs demonstrate severe degenerative joint disease of the right knee. The overall alignment is neutral. The bone quality appears to be good for age and reported activity level.  Assessment/Plan: Primary osteoarthritis, right knee   The patient history, physical examination and imaging studies are consistent with advanced degenerative joint disease of the right knee. The patient has failed conservative treatment.  The clearance notes were reviewed.  After discussion with the patient it was felt that Total Knee Replacement was indicated. The procedure,  risks, and benefits of total knee arthroplasty were presented and reviewed. The risks including but not limited to aseptic loosening, infection, blood clots, vascular injury, stiffness, patella tracking problems complications among others were discussed. The patient acknowledged the explanation, agreed to proceed with the plan.  Donia Ast 05/21/2017, 9:19 AM

## 2017-05-21 NOTE — Anesthesia Preprocedure Evaluation (Addendum)
Anesthesia Evaluation  Patient identified by MRN, date of birth, ID band Patient awake    Reviewed: Allergy & Precautions, H&P , NPO status , Patient's Chart, lab work & pertinent test results  History of Anesthesia Complications (+) PROLONGED EMERGENCE  Airway Mallampati: II  TM Distance: >3 FB Neck ROM: Full    Dental no notable dental hx. (+) Teeth Intact, Dental Advisory Given   Pulmonary neg pulmonary ROS,    Pulmonary exam normal breath sounds clear to auscultation       Cardiovascular hypertension, + dysrhythmias + pacemaker  Rhythm:Regular Rate:Normal     Neuro/Psych  Headaches, Anxiety Depression    GI/Hepatic negative GI ROS, Neg liver ROS,   Endo/Other  negative endocrine ROS  Renal/GU negative Renal ROS  negative genitourinary   Musculoskeletal  (+) Arthritis , Osteoarthritis,    Abdominal   Peds  Hematology negative hematology ROS (+)   Anesthesia Other Findings   Reproductive/Obstetrics negative OB ROS                            Anesthesia Physical Anesthesia Plan  ASA: II  Anesthesia Plan: Spinal   Post-op Pain Management:  Regional for Post-op pain   Induction: Intravenous  PONV Risk Score and Plan: 2 and Ondansetron, Dexamethasone and Propofol infusion  Airway Management Planned: Simple Face Mask  Additional Equipment:   Intra-op Plan:   Post-operative Plan:   Informed Consent: I have reviewed the patients History and Physical, chart, labs and discussed the procedure including the risks, benefits and alternatives for the proposed anesthesia with the patient or authorized representative who has indicated his/her understanding and acceptance.   Dental advisory given  Plan Discussed with: CRNA  Anesthesia Plan Comments:         Anesthesia Quick Evaluation

## 2017-05-21 NOTE — Anesthesia Procedure Notes (Signed)
Anesthesia Regional Block: Adductor canal block   Pre-Anesthetic Checklist: ,, timeout performed, Correct Patient, Correct Site, Correct Laterality, Correct Procedure, Correct Position, site marked, Risks and benefits discussed, pre-op evaluation,  At surgeon's request and post-op pain management  Laterality: Right  Prep: Maximum Sterile Barrier Precautions used, chloraprep       Needles:  Injection technique: Single-shot  Needle Type: Echogenic Stimulator Needle     Needle Length: 9cm  Needle Gauge: 21     Additional Needles:   Procedures:,,,, ultrasound used (permanent image in chart),,,,  Narrative:  Start time: 05/21/2017 11:10 AM End time: 05/21/2017 11:20 AM Injection made incrementally with aspirations every 5 mL.  Performed by: Personally  Anesthesiologist: Roderic Palau, MD  Additional Notes: 2% Lidocaine skin wheel.

## 2017-05-21 NOTE — Anesthesia Procedure Notes (Signed)
Procedure Name: MAC Date/Time: 05/21/2017 11:52 AM Performed by: Candis Shine, CRNA Pre-anesthesia Checklist: Patient identified, Emergency Drugs available, Suction available, Patient being monitored and Timeout performed Patient Re-evaluated:Patient Re-evaluated prior to induction Oxygen Delivery Method: Simple face mask Dental Injury: Teeth and Oropharynx as per pre-operative assessment

## 2017-05-21 NOTE — Transfer of Care (Signed)
Immediate Anesthesia Transfer of Care Note  Patient: TAVIAN CALLANDER  Procedure(s) Performed: RIGHT TOTAL KNEE ARTHROPLASTY (Right Knee)  Patient Location: PACU  Anesthesia Type:Spinal and MAC combined with regional for post-op pain  Level of Consciousness: awake, alert  and oriented  Airway & Oxygen Therapy: Patient Spontanous Breathing and Patient connected to face mask oxygen  Post-op Assessment: Report given to RN and Post -op Vital signs reviewed and stable  Post vital signs: Reviewed and stable  Last Vitals:  Vitals:   05/21/17 1120 05/21/17 1125  BP: (!) 158/84 (!) 177/88  Pulse: 69 70  Resp: 14 (!) 0  Temp:    SpO2: 100% 100%    Last Pain:  Vitals:   05/21/17 1120  TempSrc:   PainSc: 0-No pain      Patients Stated Pain Goal: 3 (23/34/35 6861)  Complications: No apparent anesthesia complications

## 2017-05-21 NOTE — Anesthesia Postprocedure Evaluation (Signed)
Anesthesia Post Note  Patient: CASTIEL LAURICELLA  Procedure(s) Performed: RIGHT TOTAL KNEE ARTHROPLASTY (Right Knee)     Patient location during evaluation: PACU Anesthesia Type: Spinal and Regional Level of consciousness: oriented and awake and alert Pain management: pain level controlled Vital Signs Assessment: post-procedure vital signs reviewed and stable Respiratory status: spontaneous breathing and respiratory function stable Cardiovascular status: blood pressure returned to baseline and stable Postop Assessment: no headache, no backache and no apparent nausea or vomiting Anesthetic complications: no    Last Vitals:  Vitals:   05/21/17 1425 05/21/17 1440  BP: (!) 146/81 (!) 149/78  Pulse: 64 63  Resp: 13 13  Temp:    SpO2: 99% 99%    Last Pain:  Vitals:   05/21/17 1427  TempSrc:   PainSc: 4                  Christalyn Goertz,W. EDMOND

## 2017-05-21 NOTE — Plan of Care (Signed)
  Progressing Education: Knowledge of General Education information will improve 05/21/2017 1631 - Progressing by Rance Muir, RN Health Behavior/Discharge Planning: Ability to manage health-related needs will improve 05/21/2017 1631 - Progressing by Rance Muir, RN Clinical Measurements: Ability to maintain clinical measurements within normal limits will improve 05/21/2017 1631 - Progressing by Rance Muir, RN Will remain free from infection 05/21/2017 1631 - Progressing by Rance Muir, RN Diagnostic test results will improve 05/21/2017 1631 - Progressing by Rance Muir, RN Respiratory complications will improve 05/21/2017 1631 - Progressing by Rance Muir, RN Cardiovascular complication will be avoided 05/21/2017 1631 - Progressing by Rance Muir, RN Activity: Risk for activity intolerance will decrease 05/21/2017 1631 - Progressing by Rance Muir, RN Nutrition: Adequate nutrition will be maintained 05/21/2017 1631 - Progressing by Rance Muir, RN Coping: Level of anxiety will decrease 05/21/2017 1631 - Progressing by Rance Muir, RN Elimination: Will not experience complications related to bowel motility 05/21/2017 1631 - Progressing by Rance Muir, RN Will not experience complications related to urinary retention 05/21/2017 1631 - Progressing by Rance Muir, RN Pain Managment: General experience of comfort will improve 05/21/2017 1631 - Progressing by Rance Muir, RN Safety: Ability to remain free from injury will improve 05/21/2017 1631 - Progressing by Rance Muir, RN Skin Integrity: Risk for impaired skin integrity will decrease 05/21/2017 1631 - Progressing by Rance Muir, RN

## 2017-05-22 ENCOUNTER — Encounter: Payer: Medicare Other | Admitting: Internal Medicine

## 2017-05-22 ENCOUNTER — Encounter (HOSPITAL_COMMUNITY): Payer: Self-pay | Admitting: Orthopedic Surgery

## 2017-05-22 DIAGNOSIS — M1711 Unilateral primary osteoarthritis, right knee: Secondary | ICD-10-CM | POA: Diagnosis not present

## 2017-05-22 LAB — BASIC METABOLIC PANEL
Anion gap: 13 (ref 5–15)
BUN: 13 mg/dL (ref 6–20)
CALCIUM: 8.5 mg/dL — AB (ref 8.9–10.3)
CHLORIDE: 102 mmol/L (ref 101–111)
CO2: 21 mmol/L — ABNORMAL LOW (ref 22–32)
CREATININE: 1.4 mg/dL — AB (ref 0.61–1.24)
GFR calc Af Amer: 57 mL/min — ABNORMAL LOW (ref >60)
GFR calc non Af Amer: 49 mL/min — ABNORMAL LOW (ref >60)
Glucose, Bld: 157 mg/dL — ABNORMAL HIGH (ref 65–99)
Potassium: 4 mmol/L (ref 3.5–5.1)
SODIUM: 136 mmol/L (ref 135–145)

## 2017-05-22 LAB — CBC
HCT: 41.2 % (ref 39.0–52.0)
HEMOGLOBIN: 13.7 g/dL (ref 13.0–17.0)
MCH: 29.3 pg (ref 26.0–34.0)
MCHC: 33.3 g/dL (ref 30.0–36.0)
MCV: 88 fL (ref 78.0–100.0)
Platelets: 214 10*3/uL (ref 150–400)
RBC: 4.68 MIL/uL (ref 4.22–5.81)
RDW: 14.3 % (ref 11.5–15.5)
WBC: 16.7 10*3/uL — ABNORMAL HIGH (ref 4.0–10.5)

## 2017-05-22 MED ORDER — OXYCODONE HCL 10 MG PO TABS: 10.0000 mg | ORAL_TABLET | ORAL | 0 refills | Status: DC | PRN

## 2017-05-22 MED ORDER — METHOCARBAMOL 500 MG PO TABS: 500.0000 mg | ORAL_TABLET | Freq: Four times a day (QID) | ORAL | 0 refills | Status: DC | PRN

## 2017-05-22 MED ORDER — ASPIRIN 325 MG PO TBEC: 325.0000 mg | DELAYED_RELEASE_TABLET | Freq: Two times a day (BID) | ORAL | 0 refills | Status: DC

## 2017-05-22 NOTE — Progress Notes (Signed)
SPORTS MEDICINE AND JOINT REPLACEMENT  Lara Mulch, MD    Carlyon Shadow, PA-C [address], [place], [place]  [address]                             [phone]   PROGRESS NOTE  Subjective:  negative for Chest Pain  negative for Shortness of Breath  negative for Nausea/Vomiting   negative for Calf Pain  negative for Bowel Movement   Tolerating Diet: yes         Patient reports pain as 4 on 0-10 scale.    Objective: Vital signs in last 24 hours:    Patient Vitals for the past 24 hrs:  BP Temp Temp src Pulse Resp SpO2 Height Weight  05/22/17 0559 127/70 98 F (36.7 C) Oral 72 17 99 % - -  05/21/17 2152 (!) 158/82 98.7 F (37.1 C) Oral 74 17 96 % - -  05/21/17 1700 (!) 172/91 - - 67 16 99 % - -  05/21/17 1510 (!) 133/96 97.6 F (36.4 C) - 60 16 99 % - -  05/21/17 1455 (!) 145/75 - - 60 14 98 % - -  05/21/17 1440 (!) 149/78 - - 63 13 99 % - -  05/21/17 1425 (!) 146/81 - - 64 13 99 % - -  05/21/17 1410 (!) 141/94 - - 60 12 97 % - -  05/21/17 1355 (!) 141/74 - - 60 12 96 % - -  05/21/17 1340 135/87 - - 68 19 98 % - -  05/21/17 1325 134/67 (!) 97.5 F (36.4 C) - 77 15 100 % - -  05/21/17 1125 (!) 177/88 - - 70 (!) 0 100 % - -  05/21/17 1120 (!) 158/84 - - 69 14 100 % - -  05/21/17 0942 - - - - - - 5\' 7"  (1.702 m) 67.1 kg (148 lb)  05/21/17 0911 (!) 153/82 (!) 97.5 F (36.4 C) Oral 60 20 100 % - -    @flow {1959:LAST@   Intake/Output from previous day:   03/18 0701 - 03/19 0700 In: 7741 [P.O.:720; I.V.:800] Out: 550 [Urine:500]   Intake/Output this shift:   No intake/output data recorded.   Intake/Output      03/18 0701 - 03/19 0700 03/19 0701 - 03/20 0700   P.O. 720    I.V. (mL/kg) 800 (11.9)    Other 75    Total Intake(mL/kg) 1595 (23.8)    Urine (mL/kg/hr) 500    Blood 50    Total Output 550    Net +1045            LABORATORY DATA: No results for input(s): WBC, HGB, HCT, PLT in the last 168 hours. No results for input(s): NA, K, CL, CO2, BUN,  CREATININE, GLUCOSE, CALCIUM in the last 168 hours. No results found for: INR, PROTIME  Examination:  General appearance: alert, cooperative and no distress Extremities: extremities normal, atraumatic, no cyanosis or edema  Wound Exam: clean, dry, intact   Drainage:  None: wound tissue dry  Motor Exam: Quadriceps and Hamstrings Intact  Sensory Exam: Superficial Peroneal, Deep Peroneal and Tibial normal   Assessment:    1 Day Post-Op  Procedure(s) (LRB): RIGHT TOTAL KNEE ARTHROPLASTY (Right)  ADDITIONAL DIAGNOSIS:  Active Problems:   S/P total knee replacement     Plan: Physical Therapy as ordered Weight Bearing as Tolerated (WBAT)  DVT Prophylaxis:  Aspirin  DISCHARGE PLAN: Home  DISCHARGE NEEDS: HHPT   Patient progressing well, expected D/C home today         Donia Ast 05/22/2017, 7:12 AM

## 2017-05-22 NOTE — Plan of Care (Signed)
  Progressing Education: Knowledge of General Education information will improve 05/22/2017 1050 - Progressing by Rance Muir, RN Health Behavior/Discharge Planning: Ability to manage health-related needs will improve 05/22/2017 1050 - Progressing by Rance Muir, RN Clinical Measurements: Ability to maintain clinical measurements within normal limits will improve 05/22/2017 1050 - Progressing by Rance Muir, RN Will remain free from infection 05/22/2017 1050 - Progressing by Rance Muir, RN Diagnostic test results will improve 05/22/2017 1050 - Progressing by Rance Muir, RN Respiratory complications will improve 05/22/2017 1050 - Progressing by Rance Muir, RN Cardiovascular complication will be avoided 05/22/2017 1050 - Progressing by Rance Muir, RN Activity: Risk for activity intolerance will decrease 05/22/2017 1050 - Progressing by Rance Muir, RN Nutrition: Adequate nutrition will be maintained 05/22/2017 1050 - Progressing by Rance Muir, RN Coping: Level of anxiety will decrease 05/22/2017 1050 - Progressing by Rance Muir, RN Elimination: Will not experience complications related to bowel motility 05/22/2017 1050 - Progressing by Rance Muir, RN Will not experience complications related to urinary retention 05/22/2017 1050 - Progressing by Rance Muir, RN Pain Managment: General experience of comfort will improve 05/22/2017 1050 - Progressing by Rance Muir, RN Safety: Ability to remain free from injury will improve 05/22/2017 1050 - Progressing by Rance Muir, RN Skin Integrity: Risk for impaired skin integrity will decrease 05/22/2017 1050 - Progressing by Rance Muir, RN

## 2017-05-22 NOTE — Op Note (Signed)
TOTAL KNEE REPLACEMENT OPERATIVE NOTE:  05/21/2017  1:59 PM  PATIENT:  Phillip Duran  72 y.o. male  PRE-OPERATIVE DIAGNOSIS:  primary osteoarthritis right knee  POST-OPERATIVE DIAGNOSIS:  primary osteoarthritis right knee  PROCEDURE:  Procedure(s): RIGHT TOTAL KNEE ARTHROPLASTY  SURGEON:  Surgeon(s): Vickey Huger, MD  PHYSICIAN ASSISTANT: Carlyon Shadow, [facility]  ANESTHESIA:   spinal  DRAINS: Hemovac  SPECIMEN: None  COUNTS:  Correct  TOURNIQUET:   Total Tourniquet Time Documented: Thigh (Right) - 35 minutes Total: Thigh (Right) - 35 minutes   DICTATION:  Indication for procedure:    The patient is a 72 y.o. male who has failed conservative treatment for primary osteoarthritis right knee.  Informed consent was obtained prior to anesthesia. The risks versus benefits of the operation were explain and in a way the patient can, and did, understand.   On the implant demand matching protocol, this patient scored 9.  Therefore, this patient was not receive a polyethylene insert with vitamin E which is a high demand implant.  Description of procedure:     The patient was taken to the operating room and placed under anesthesia.  The patient was positioned in the usual fashion taking care that all body parts were adequately padded and/or protected.  I foley catheter was not placed.  A tourniquet was applied and the leg prepped and draped in the usual sterile fashion.  The extremity was exsanguinated with the esmarch and tourniquet inflated to 350 mmHg.  Pre-operative range of motion was normal.  The knee was in 6 degree of mild varus.  A midline incision approximately 6-7 inches long was made with a #10 blade.  A new blade was used to make a parapatellar arthrotomy going 2-3 cm into the quadriceps tendon, over the patella, and alongside the medial aspect of the patellar tendon.  A synovectomy was then performed with the #10 blade and forceps. I then elevated the deep MCL off the medial  tibial metaphysis subperiosteally around to the semimembranosus attachment.    I everted the patella and used calipers to measure patellar thickness.  I used the reamer to ream down to appropriate thickness to recreate the native thickness.  I then removed excess bone with the rongeur and sagittal saw.  I used the appropriately sized template and drilled the three lug holes.  I then put the trial in place and measured the thickness with the calipers to ensure recreation of the native thickness.  The trial was then removed and the patella subluxed and the knee brought into flexion.  A homan retractor was place to retract and protect the patella and lateral structures.  A Z-retractor was place medially to protect the medial structures.  The extra-medullary alignment system was used to make cut the tibial articular surface perpendicular to the anamotic axis of the tibia and in 3 degrees of posterior slope.  The cut surface and alignment jig was removed.  I then used the intramedullary alignment guide to make a 6 valgus cut on the distal femur.  I then marked out the epicondylar axis on the distal femur.  The posterior condylar axis measured 3 degrees.  I then used the anterior referencing sizer and measured the femur to be a size 8.  The 4-In-1 cutting block was screwed into place in external rotation matching the posterior condylar angle, making our cuts perpendicular to the epicondylar axis.  Anterior, posterior and chamfer cuts were made with the sagittal saw.  The cutting block and cut pieces  were removed.  A lamina spreader was placed in 90 degrees of flexion.  The ACL, PCL, menisci, and posterior condylar osteophytes were removed.  A 10 mm spacer blocked was found to offer good flexion and extension gap balance after moderate in degree releasing.   The scoop retractor was then placed and the femoral finishing block was pinned in place.  The small sagittal saw was used as well as the lug drill to finish  the femur.  The block and cut surfaces were removed and the medullary canal hole filled with autograft bone from the cut pieces.  The tibia was delivered forward in deep flexion and external rotation.  A size E tray was selected and pinned into place centered on the medial 1/3 of the tibial tubercle.  The reamer and keel was used to prepare the tibia through the tray.    I then trialed with the size 8 femur, size E tibia, a 10 mm insert and the 32 patella.  I had excellent flexion/extension gap balance, excellent patella tracking.  Flexion was full and beyond 120 degrees; extension was zero.  These components were chosen and the staff opened them to me on the back table while the knee was lavaged copiously and the cement mixed.  The soft tissue was infiltrated with 60cc of exparel 1.3% through a 21 gauge needle.  I cemented in the components and removed all excess cement.  The polyethylene tibial component was snapped into place and the knee placed in extension while cement was hardening.  The capsule was infilltrated with 30cc of .25% Marcaine with epinephrine.  A hemovac was place in the joint exiting superolaterally.  A pain pump was place superomedially superficial to the arthrotomy.  Once the cement was hard, the tourniquet was let down.  Hemostasis was obtained.  The arthrotomy was closed with figure-8 #1 vicryl sutures.  The deep soft tissues were closed with #0 vicryls and the subcuticular layer closed with a running #2-0 vicryl.  The skin was reapproximated and closed with skin staples.  The wound was dressed with xeroform, 4 x4's, 2 ABD sponges, a single layer of webril and a TED stocking.   The patient was then awakened, extubated, and taken to the recovery room in stable condition.  BLOOD LOSS:  300cc DRAINS: 1 hemovac, 1 pain catheter COMPLICATIONS:  None.  PLAN OF CARE: Admit for overnight observation  PATIENT DISPOSITION:  PACU - hemodynamically stable.   Delay start of  Pharmacological VTE agent (>24hrs) due to surgical blood loss or risk of bleeding:  not applicable  Please fax a copy of this op note to my office at [phone] (please only include page 1 and 2 of the Case Information op note)

## 2017-05-22 NOTE — Progress Notes (Signed)
Physical Therapy Treatment Patient Details Name: Phillip Duran MRN: [id number] DOB: [birth year] Today's Date: 05/22/2017    History of Present Illness pt is a 72 y/o male with pmh significant for prostate CA, Pacer, HTN admitted for elective R TKA    PT Comments    Patient is making good progress with PT.  From a mobility standpoint anticipate patient will be ready for DC home when medically ready.    Follow Up Recommendations  Follow surgeon's recommendation for DC plan and follow-up therapies     Equipment Recommendations  (TBA)    Recommendations for Other Services       Precautions / Restrictions Precautions Precautions: Knee Precaution Comments: knee precautions reviewed with pt Restrictions Weight Bearing Restrictions: Yes RLE Weight Bearing: Weight bearing as tolerated    Mobility  Bed Mobility Overal bed mobility: Modified Independent                Transfers Overall transfer level: Needs assistance Equipment used: Rolling walker (2 wheeled) Transfers: Sit to/from Stand Sit to Stand: Min guard         General transfer comment: cues for safe hand placement  Ambulation/Gait Ambulation/Gait assistance: Supervision Ambulation Distance (Feet): 200 Feet Assistive device: Rolling walker (2 wheeled) Gait Pattern/deviations: Step-to pattern;Step-through pattern;Decreased stance time - right;Decreased step length - left;Decreased weight shift to right     General Gait Details: cues for sequencing, increase R knee flexion during swing phase, and step length symmetry   Stairs Stairs: Yes   Stair Management: One rail Right;Step to pattern;Sideways Number of Stairs: 4 General stair comments: cues for sequencing and technique  Wheelchair Mobility    Modified Rankin (Stroke Patients Only)       Balance Overall balance assessment: No apparent balance deficits (not formally assessed)                                           Cognition Arousal/Alertness: Awake/alert Behavior During Therapy: WFL for tasks assessed/performed Overall Cognitive Status: Within Functional Limits for tasks assessed                                        Exercises Total Joint Exercises Quad Sets: AROM;Both;10 reps Short Arc Quad: AROM;Right;10 reps;Supine Heel Slides: AROM;Right;10 reps Hip ABduction/ADduction: AROM;Right;10 reps Straight Leg Raises: AROM;Right;10 reps Long Arc Quad: AROM;Right;10 reps Knee Flexion: AROM;Right;10 reps;Seated;Other (comment)(10 sec holds) Goniometric ROM: 0-95    General Comments        Pertinent Vitals/Pain Pain Assessment: Faces Faces Pain Scale: Hurts little more Pain Location: R knee Pain Descriptors / Indicators: Grimacing;Sore Pain Intervention(s): Monitored during session;Premedicated before session;Repositioned;Limited activity within patient's tolerance;Ice applied    Home Living                      Prior Function            PT Goals (current goals can now be found in the care plan section) Acute Rehab PT Goals PT Goal Formulation: With patient Time For Goal Achievement: 05/28/17 Potential to Achieve Goals: Good Progress towards PT goals: Progressing toward goals    Frequency    7X/week      PT Plan Current plan remains appropriate    Co-evaluation  AM-PAC PT "6 Clicks" Daily Activity  Outcome Measure  Difficulty turning over in bed (including adjusting bedclothes, sheets and blankets)?: None Difficulty moving from lying on back to sitting on the side of the bed? : None Difficulty sitting down on and standing up from a chair with arms (e.g., wheelchair, bedside commode, etc,.)?: A Little Help needed moving to and from a bed to chair (including a wheelchair)?: A Little Help needed walking in hospital room?: A Little Help needed climbing 3-5 steps with a railing? : A Little 6 Click Score: 20    End of Session  Equipment Utilized During Treatment: Gait belt Activity Tolerance: Patient tolerated treatment well Patient left: with call bell/phone within reach;with family/visitor present;in bed Nurse Communication: Mobility status PT Visit Diagnosis: Other abnormalities of gait and mobility (R26.89);Pain Pain - Right/Left: Right Pain - part of body: Knee     Time: 2979-8921 PT Time Calculation (min) (ACUTE ONLY): 18 min  Charges:  $Gait Training: 8-22 mins $Therapeutic Exercise: 8-22 mins                    G Codes:       Earney Navy, PTA Pager: [phone]     Darliss Cheney 05/22/2017, 2:52 PM

## 2017-05-22 NOTE — Discharge Summary (Signed)
SPORTS MEDICINE & JOINT REPLACEMENT   Lara Mulch, MD   Carlyon Shadow, PA-C [place], [place], [place]  [address]                             [phone]  PATIENT ID: Phillip Duran        MRN:  [id number]          DOB/AGE: 71-Sep-1947 / 71 y.o.    DISCHARGE SUMMARY  ADMISSION DATE:    05/21/2017 DISCHARGE DATE:   05/22/2017   ADMISSION DIAGNOSIS: primary osteoarthritis right knee    DISCHARGE DIAGNOSIS:  primary osteoarthritis right knee    ADDITIONAL DIAGNOSIS: Active Problems:   S/P total knee replacement  Past Medical History:  Diagnosis Date  . Anxiety   . Arthritis   . Complication of anesthesia    "hard time waking up"  . Depression   . Diverticulitis   . Dysrhythmia    2:1 AV Block - pacemaker insertion by Dr. Lovena Le   . Headache   . Hypertension   . Pneumonia    aspiration pneumonia  . Presence of permanent cardiac pacemaker    managed by Dr. Lovena Le, [facility]  . Prostate cancer [facility])     PROCEDURE: Procedure(s): RIGHT TOTAL KNEE ARTHROPLASTY on 05/21/2017  CONSULTS:    HISTORY:  See H&P in chart  HOSPITAL COURSE:  Phillip Duran is a 72 y.o. admitted on 05/21/2017 and found to have a diagnosis of primary osteoarthritis right knee.  After appropriate laboratory studies were obtained  they were taken to the operating room on 05/21/2017 and underwent Procedure(s): RIGHT TOTAL KNEE ARTHROPLASTY.   They were given perioperative antibiotics:  Anti-infectives (From admission, onward)   Start     Dose/Rate Route Frequency Ordered Stop   05/21/17 1730  ceFAZolin (ANCEF) IVPB 2g/100 mL premix     2 g 200 mL/hr over 30 Minutes Intravenous Every 6 hours 05/21/17 1546 05/21/17 2308   05/21/17 0935  ceFAZolin (ANCEF) IVPB 2g/100 mL premix     2 g 200 mL/hr over 30 Minutes Intravenous On call to O.R. 05/21/17 0935 05/21/17 1148    .  Patient given tranexamic acid IV or topical and exparel intra-operatively.  Tolerated the procedure well.     POD# 1: Vital signs were stable.  Patient denied Chest pain, shortness of breath, or calf pain.  Patient was started on Lovenox 30 mg subcutaneously twice daily at 8am.  Consults to PT, OT, and care management were made.  The patient was weight bearing as tolerated.  CPM was placed on the operative leg 0-90 degrees for 6-8 hours a day. When out of the CPM, patient was placed in the foam block to achieve full extension. Incentive spirometry was taught.  Dressing was changed.       POD #2, Continued  PT for ambulation and exercise program.  IV saline locked.  O2 discontinued.    The remainder of the hospital course was dedicated to ambulation and strengthening.   The patient was discharged on 1 Day Post-Op in  Good condition.  Blood products given:none  DIAGNOSTIC STUDIES: Recent vital signs:  Patient Vitals for the past 24 hrs:  BP Temp Temp src Pulse Resp SpO2  05/22/17 0559 127/70 98 F (36.7 C) Oral 72 17 99 %  05/21/17 2152 (!) 158/82 98.7 F (37.1 C) Oral 74 17 96 %  05/21/17 1700 (!) 172/91 - - 67  16 99 %  05/21/17 1510 (!) 133/96 97.6 F (36.4 C) - 60 16 99 %  05/21/17 1455 (!) 145/75 - - 60 14 98 %  05/21/17 1440 (!) 149/78 - - 63 13 99 %  05/21/17 1425 (!) 146/81 - - 64 13 99 %  05/21/17 1410 (!) 141/94 - - 60 12 97 %  05/21/17 1355 (!) 141/74 - - 60 12 96 %  05/21/17 1340 135/87 - - 68 19 98 %  05/21/17 1325 134/67 (!) 97.5 F (36.4 C) - 77 15 100 %       Recent laboratory studies: Recent Labs    05/22/17 0751  WBC 16.7*  HGB 13.7  HCT 41.2  PLT 214   Recent Labs    05/22/17 0751  NA 136  K 4.0  CL 102  CO2 21*  BUN 13  CREATININE 1.40*  GLUCOSE 157*  CALCIUM 8.5*   No results found for: INR, PROTIME   Recent Radiographic Studies :  No results found.  DISCHARGE INSTRUCTIONS: Discharge Instructions    CPM   Complete by:  As directed    Continuous passive motion machine (CPM):      Use the CPM from 0 to 90 for 4-6 hours per day.      You may  increase by 10 per day.  You may break it up into 2 or 3 sessions per day.      Use CPM for 2 weeks or until you are told to stop.   Call MD / Call 911   Complete by:  As directed    If you experience chest pain or shortness of breath, CALL 911 and be transported to the hospital emergency room.  If you develope a fever above 101 F, pus (white drainage) or increased drainage or redness at the wound, or calf pain, call your surgeon's office.   Constipation Prevention   Complete by:  As directed    Drink plenty of fluids.  Prune juice may be helpful.  You may use a stool softener, such as Colace (over the counter) 100 mg twice a day.  Use MiraLax (over the counter) for constipation as needed.   Diet - low sodium heart healthy   Complete by:  As directed    Discharge instructions   Complete by:  As directed    INSTRUCTIONS AFTER JOINT REPLACEMENT   Remove items at home which could result in a fall. This includes throw rugs or furniture in walking pathways ICE to the affected joint every three hours while awake for 30 minutes at a time, for at least the first 3-5 days, and then as needed for pain and swelling.  Continue to use ice for pain and swelling. You may notice swelling that will progress down to the foot and ankle.  This is normal after surgery.  Elevate your leg when you are not up walking on it.   Continue to use the breathing machine you got in the hospital (incentive spirometer) which will help keep your temperature down.  It is common for your temperature to cycle up and down following surgery, especially at night when you are not up moving around and exerting yourself.  The breathing machine keeps your lungs expanded and your temperature down.   DIET:  As you were doing prior to hospitalization, we recommend a well-balanced diet.  DRESSING / WOUND CARE / SHOWERING  Keep the surgical dressing until follow up.  The dressing is water proof, so you  can shower without any extra covering.   IF THE DRESSING FALLS OFF or the wound gets wet inside, change the dressing with sterile gauze.  Please use good hand washing techniques before changing the dressing.  Do not use any lotions or creams on the incision until instructed by your surgeon.    ACTIVITY  Increase activity slowly as tolerated, but follow the weight bearing instructions below.   No driving for 6 weeks or until further direction given by your physician.  You cannot drive while taking narcotics.  No lifting or carrying greater than 10 lbs. until further directed by your surgeon. Avoid periods of inactivity such as sitting longer than an hour when not asleep. This helps prevent blood clots.  You may return to work once you are authorized by your doctor.     WEIGHT BEARING   Weight bearing as tolerated with assist device (walker, cane, etc) as directed, use it as long as suggested by your surgeon or therapist, typically at least 4-6 weeks.   EXERCISES  Results after joint replacement surgery are often greatly improved when you follow the exercise, range of motion and muscle strengthening exercises prescribed by your doctor. Safety measures are also important to protect the joint from further injury. Any time any of these exercises cause you to have increased pain or swelling, decrease what you are doing until you are comfortable again and then slowly increase them. If you have problems or questions, call your caregiver or physical therapist for advice.   Rehabilitation is important following a joint replacement. After just a few days of immobilization, the muscles of the leg can become weakened and shrink (atrophy).  These exercises are designed to build up the tone and strength of the thigh and leg muscles and to improve motion. Often times heat used for twenty to thirty minutes before working out will loosen up your tissues and help with improving the range of motion but do not use heat for the first two weeks following  surgery (sometimes heat can increase post-operative swelling).   These exercises can be done on a training (exercise) mat, on the floor, on a table or on a bed. Use whatever works the best and is most comfortable for you.    Use music or television while you are exercising so that the exercises are a pleasant break in your day. This will make your life better with the exercises acting as a break in your routine that you can look forward to.   Perform all exercises about fifteen times, three times per day or as directed.  You should exercise both the operative leg and the other leg as well.   Exercises include:   Quad Sets - Tighten up the muscle on the front of the thigh (Quad) and hold for 5-10 seconds.   Straight Leg Raises - With your knee straight (if you were given a brace, keep it on), lift the leg to 60 degrees, hold for 3 seconds, and slowly lower the leg.  Perform this exercise against resistance later as your leg gets stronger.  Leg Slides: Lying on your back, slowly slide your foot toward your buttocks, bending your knee up off the floor (only go as far as is comfortable). Then slowly slide your foot back down until your leg is flat on the floor again.  Angel Wings: Lying on your back spread your legs to the side as far apart as you can without causing discomfort.  Hamstring Strength:  Lying on  your back, push your heel against the floor with your leg straight by tightening up the muscles of your buttocks.  Repeat, but this time bend your knee to a comfortable angle, and push your heel against the floor.  You may put a pillow under the heel to make it more comfortable if necessary.   A rehabilitation program following joint replacement surgery can speed recovery and prevent re-injury in the future due to weakened muscles. Contact your doctor or a physical therapist for more information on knee rehabilitation.    CONSTIPATION  Constipation is defined medically as fewer than three stools  per week and severe constipation as less than one stool per week.  Even if you have a regular bowel pattern at home, your normal regimen is likely to be disrupted due to multiple reasons following surgery.  Combination of anesthesia, postoperative narcotics, change in appetite and fluid intake all can affect your bowels.   YOU MUST use at least one of the following options; they are listed in order of increasing strength to get the job done.  They are all available over the counter, and you may need to use some, POSSIBLY even all of these options:    Drink plenty of fluids (prune juice may be helpful) and high fiber foods Colace 100 mg by mouth twice a day  Senokot for constipation as directed and as needed Dulcolax (bisacodyl), take with full glass of water  Miralax (polyethylene glycol) once or twice a day as needed.  If you have tried all these things and are unable to have a bowel movement in the first 3-4 days after surgery call either your surgeon or your primary doctor.    If you experience loose stools or diarrhea, hold the medications until you stool forms back up.  If your symptoms do not get better within 1 week or if they get worse, check with your doctor.  If you experience "the worst abdominal pain ever" or develop nausea or vomiting, please contact the office immediately for further recommendations for treatment.   ITCHING:  If you experience itching with your medications, try taking only a single pain pill, or even half a pain pill at a time.  You can also use Benadryl over the counter for itching or also to help with sleep.   TED HOSE STOCKINGS:  Use stockings on both legs until for at least 2 weeks or as directed by physician office. They may be removed at night for sleeping.  MEDICATIONS:  See your medication summary on the "After Visit Summary" that nursing will review with you.  You may have some home medications which will be placed on hold until you complete the course of  blood thinner medication.  It is important for you to complete the blood thinner medication as prescribed.  PRECAUTIONS:  If you experience chest pain or shortness of breath - call 911 immediately for transfer to the hospital emergency department.   If you develop a fever greater that 101 F, purulent drainage from wound, increased redness or drainage from wound, foul odor from the wound/dressing, or calf pain - CONTACT YOUR SURGEON.                                                   FOLLOW-UP APPOINTMENTS:  If you do not already have a post-op appointment, please  call the office for an appointment to be seen by your surgeon.  Guidelines for how soon to be seen are listed in your "After Visit Summary", but are typically between 1-4 weeks after surgery.  OTHER INSTRUCTIONS:   Knee Replacement:  Do not place pillow under knee, focus on keeping the knee straight while resting. CPM instructions: 0-90 degrees, 2 hours in the morning, 2 hours in the afternoon, and 2 hours in the evening. Place foam block, curve side up under heel at all times except when in CPM or when walking.  DO NOT modify, tear, cut, or change the foam block in any way.  MAKE SURE YOU:  Understand these instructions.  Get help right away if you are not doing well or get worse.    Thank you for letting us be a part of your medical care team.  It is a privilege we respect greatly.  We hope these instructions will help you stay on track for a fast and full recovery!   Increase activity slowly as tolerated   Complete by:  As directed       DISCHARGE MEDICATIONS:   Allergies as of 05/22/2017      Reactions   Morphine And Related Nausea And Vomiting   Other Other (See Comments)   Anesthesia--difficulty waking patient      Medication List    TAKE these medications   aspirin 325 MG EC tablet Take 1 tablet (325 mg total) by mouth 2 (two) times daily.   methocarbamol 500 MG tablet Commonly known as:  ROBAXIN Take 1-2 tablets  (500-1,000 mg total) by mouth every 6 (six) hours as needed for muscle spasms.   Oxycodone HCl 10 MG Tabs Take 1 tablet (10 mg total) by mouth every 4 (four) hours as needed for moderate pain (pain score 4-6).            Durable Medical Equipment  (From admission, onward)        Start     Ordered   05/21/17 1547  DME Walker rolling  Once    Question:  Patient needs a walker to treat with the following condition  Answer:  S/P total knee replacement   05/21/17 1546   05/21/17 1547  DME 3 n 1  Once     05/21/17 1546   05/21/17 1547  DME Bedside commode  Once    Question:  Patient needs a bedside commode to treat with the following condition  Answer:  S/P total knee replacement   05/21/17 1546      FOLLOW UP VISIT:    DISPOSITION: HOME VS. SNF  CONDITION:  Good   Donia Ast 05/22/2017, 12:47 PM

## 2017-05-22 NOTE — Progress Notes (Signed)
Physical Therapy Treatment Patient Details Name: Phillip Duran MRN: [id number] DOB: [birth year] Today's Date: 05/22/2017    History of Present Illness pt is a 72 y/o male with pmh significant for prostate CA, Pacer, HTN admitted for elective R TKA    PT Comments    Patient is making good progress with PT.  From a mobility standpoint anticipate patient will be ready for DC home when medically ready.    Follow Up Recommendations  Follow surgeon's recommendation for DC plan and follow-up therapies     Equipment Recommendations  RW; 3 in 1    Recommendations for Other Services       Precautions / Restrictions Precautions Precautions: Knee Precaution Comments: knee precautions reviewed with pt Restrictions Weight Bearing Restrictions: Yes RLE Weight Bearing: Weight bearing as tolerated    Mobility  Bed Mobility Overal bed mobility: Modified Independent                Transfers Overall transfer level: Needs assistance Equipment used: Rolling walker (2 wheeled) Transfers: Sit to/from Stand Sit to Stand: Min guard         General transfer comment: cues for safe hand placement  Ambulation/Gait Ambulation/Gait assistance: Supervision Ambulation Distance (Feet): 200 Feet Assistive device: Rolling walker (2 wheeled) Gait Pattern/deviations: Step-to pattern;Step-through pattern;Decreased stance time - right;Decreased step length - left;Decreased weight shift to right     General Gait Details: cues for sequencing, increase R knee flexion during swing phase, and step length symmetry   Stairs Stairs: Yes   Stair Management: One rail Right;Step to pattern;Sideways Number of Stairs: 4 General stair comments: cues for sequencing and technique  Wheelchair Mobility    Modified Rankin (Stroke Patients Only)       Balance Overall balance assessment: No apparent balance deficits (not formally assessed)                                           Cognition Arousal/Alertness: Awake/alert Behavior During Therapy: WFL for tasks assessed/performed Overall Cognitive Status: Within Functional Limits for tasks assessed                                        Exercises Total Joint Exercises Quad Sets: AROM;Both;10 reps Heel Slides: AROM;Right;10 reps Straight Leg Raises: AROM;Right;10 reps Goniometric ROM: 0-90    General Comments        Pertinent Vitals/Pain Pain Assessment: Faces Faces Pain Scale: Hurts little more Pain Location: R knee Pain Descriptors / Indicators: Grimacing;Guarding;Sore Pain Intervention(s): Limited activity within patient's tolerance;Monitored during session;Premedicated before session;Repositioned;Ice applied    Home Living                      Prior Function            PT Goals (current goals can now be found in the care plan section) Acute Rehab PT Goals PT Goal Formulation: With patient Time For Goal Achievement: 05/28/17 Potential to Achieve Goals: Good Progress towards PT goals: Progressing toward goals    Frequency    7X/week      PT Plan Current plan remains appropriate    Co-evaluation              AM-PAC PT "6 Clicks" Daily Activity  Outcome Measure  Difficulty turning  over in bed (including adjusting bedclothes, sheets and blankets)?: None Difficulty moving from lying on back to sitting on the side of the bed? : None Difficulty sitting down on and standing up from a chair with arms (e.g., wheelchair, bedside commode, etc,.)?: A Little Help needed moving to and from a bed to chair (including a wheelchair)?: A Little Help needed walking in hospital room?: A Little Help needed climbing 3-5 steps with a railing? : A Little 6 Click Score: 20    End of Session Equipment Utilized During Treatment: Gait belt Activity Tolerance: Patient tolerated treatment well Patient left: with call bell/phone within reach;with family/visitor present;in  chair Nurse Communication: Mobility status PT Visit Diagnosis: Other abnormalities of gait and mobility (R26.89);Pain Pain - Right/Left: Right Pain - part of body: Knee     Time: 6015-6153 PT Time Calculation (min) (ACUTE ONLY): 28 min  Charges:  $Gait Training: 8-22 mins $Therapeutic Exercise: 8-22 mins                    G Codes:       Earney Navy, PTA Pager: [phone]     Darliss Cheney 05/22/2017, 11:08 AM

## 2017-05-24 ENCOUNTER — Encounter (HOSPITAL_COMMUNITY): Payer: Self-pay | Admitting: Orthopedic Surgery

## 2017-06-04 ENCOUNTER — Ambulatory Visit (INDEPENDENT_AMBULATORY_CARE_PROVIDER_SITE_OTHER): Payer: Medicare Other | Admitting: *Deleted

## 2017-06-04 DIAGNOSIS — I441 Atrioventricular block, second degree: Secondary | ICD-10-CM

## 2017-06-04 NOTE — Progress Notes (Signed)
Remote pacemaker transmission.   

## 2017-06-05 ENCOUNTER — Encounter: Payer: Self-pay | Admitting: Cardiology

## 2017-06-19 LAB — CUP PACEART REMOTE DEVICE CHECK
Brady Statistic RA Percent Paced: 18 %
Brady Statistic RV Percent Paced: 100 %
Implantable Lead Implant Date: 20180125
Implantable Lead Implant Date: 20180125
Implantable Lead Location: 753860
Implantable Lead Model: 7741
Lead Channel Impedance Value: 628 Ohm
Lead Channel Impedance Value: 680 Ohm
Lead Channel Pacing Threshold Amplitude: 0.4 V
Lead Channel Pacing Threshold Amplitude: 0.8 V
Lead Channel Pacing Threshold Pulse Width: 0.4 ms
Lead Channel Setting Pacing Amplitude: 2 V
MDC IDC LEAD LOCATION: 753859
MDC IDC LEAD SERIAL: 693139
MDC IDC LEAD SERIAL: 851154
MDC IDC MSMT BATTERY REMAINING LONGEVITY: 156 mo
MDC IDC MSMT BATTERY REMAINING PERCENTAGE: 100 %
MDC IDC MSMT LEADCHNL RV PACING THRESHOLD PULSEWIDTH: 0.4 ms
MDC IDC PG IMPLANT DT: 20180125
MDC IDC SESS DTM: 20190401064300
MDC IDC SET LEADCHNL RV PACING AMPLITUDE: 2.5 V
MDC IDC SET LEADCHNL RV PACING PULSEWIDTH: 0.4 ms
MDC IDC SET LEADCHNL RV SENSING SENSITIVITY: 2.5 mV
Pulse Gen Serial Number: 773204

## 2017-09-03 ENCOUNTER — Ambulatory Visit (INDEPENDENT_AMBULATORY_CARE_PROVIDER_SITE_OTHER): Payer: Medicare Other | Admitting: *Deleted

## 2017-09-03 ENCOUNTER — Telehealth: Payer: Self-pay | Admitting: Cardiology

## 2017-09-03 DIAGNOSIS — I441 Atrioventricular block, second degree: Secondary | ICD-10-CM | POA: Diagnosis not present

## 2017-09-03 NOTE — Telephone Encounter (Signed)
LMOVM reminding pt to send remote transmission.   

## 2017-09-05 ENCOUNTER — Encounter: Payer: Self-pay | Admitting: Cardiology

## 2017-09-05 NOTE — Progress Notes (Signed)
Remote pacemaker transmission.   

## 2017-10-02 LAB — CUP PACEART REMOTE DEVICE CHECK
Battery Remaining Longevity: 156 mo
Brady Statistic RA Percent Paced: 13 %
Brady Statistic RV Percent Paced: 100 %
Date Time Interrogation Session: 20190703160600
Implantable Lead Location: 753859
Implantable Lead Model: 7740
Implantable Lead Model: 7741
Lead Channel Impedance Value: 603 Ohm
Lead Channel Pacing Threshold Amplitude: 1 V
Lead Channel Setting Pacing Amplitude: 2 V
Lead Channel Setting Pacing Amplitude: 2.5 V
Lead Channel Setting Pacing Pulse Width: 0.4 ms
MDC IDC LEAD IMPLANT DT: 20180125
MDC IDC LEAD IMPLANT DT: 20180125
MDC IDC LEAD LOCATION: 753860
MDC IDC LEAD SERIAL: 693139
MDC IDC LEAD SERIAL: 851154
MDC IDC MSMT BATTERY REMAINING PERCENTAGE: 100 %
MDC IDC MSMT LEADCHNL RA PACING THRESHOLD AMPLITUDE: 0.4 V
MDC IDC MSMT LEADCHNL RA PACING THRESHOLD PULSEWIDTH: 0.4 ms
MDC IDC MSMT LEADCHNL RV IMPEDANCE VALUE: 695 Ohm
MDC IDC MSMT LEADCHNL RV PACING THRESHOLD PULSEWIDTH: 0.4 ms
MDC IDC PG IMPLANT DT: 20180125
MDC IDC PG SERIAL: 773204
MDC IDC SET LEADCHNL RV SENSING SENSITIVITY: 2.5 mV

## 2017-12-05 ENCOUNTER — Ambulatory Visit (INDEPENDENT_AMBULATORY_CARE_PROVIDER_SITE_OTHER): Payer: Medicare Other | Admitting: *Deleted

## 2017-12-05 DIAGNOSIS — I441 Atrioventricular block, second degree: Secondary | ICD-10-CM | POA: Diagnosis not present

## 2017-12-05 NOTE — Progress Notes (Signed)
Remote pacemaker transmission.   

## 2017-12-28 LAB — CUP PACEART REMOTE DEVICE CHECK
Date Time Interrogation Session: 20191025132553
Implantable Lead Implant Date: 20180125
Implantable Lead Implant Date: 20180125
Implantable Lead Location: 753859
Implantable Lead Location: 753860
Implantable Lead Model: 7740
Implantable Lead Model: 7741
Implantable Lead Serial Number: 693139
MDC IDC LEAD SERIAL: 851154
MDC IDC PG IMPLANT DT: 20180125
Pulse Gen Serial Number: 773204

## 2018-01-23 ENCOUNTER — Telehealth: Payer: Self-pay | Admitting: Cardiology

## 2018-01-23 ENCOUNTER — Telehealth: Payer: Self-pay | Admitting: Internal Medicine

## 2018-01-23 NOTE — Telephone Encounter (Signed)
New Message          Patient called today complaining of left arm numbness since his procedure. Patient also wanted to make sure his transmission with through. I put patient through to [place].

## 2018-01-23 NOTE — Telephone Encounter (Signed)
Patient called and stated that his left arm for the last several days has been tingling and numb. He states he feels this all the time.

## 2018-01-23 NOTE — Telephone Encounter (Signed)
Spoke with pt informed him that I had reviewed his transmission and that his pacing wires were working normally, informed pt that since his device has been in since Jan. 2018 it is unlikely that it has anything to do with the pacemaker. Pt denied any swelling, or tenderness at [facility] site.  Advised pt to contact his primary care provider

## 2018-01-23 NOTE — Telephone Encounter (Signed)
Duplicate.  See other phone note.

## 2018-03-07 ENCOUNTER — Ambulatory Visit (INDEPENDENT_AMBULATORY_CARE_PROVIDER_SITE_OTHER): Payer: Medicare Other

## 2018-03-07 DIAGNOSIS — R001 Bradycardia, unspecified: Secondary | ICD-10-CM

## 2018-03-07 DIAGNOSIS — I441 Atrioventricular block, second degree: Secondary | ICD-10-CM | POA: Diagnosis not present

## 2018-03-07 LAB — CUP PACEART REMOTE DEVICE CHECK
Date Time Interrogation Session: 20200102202618
Implantable Lead Implant Date: 20180125
Implantable Lead Location: 753859
Implantable Lead Model: 7741
Implantable Pulse Generator Implant Date: 20180125
MDC IDC LEAD IMPLANT DT: 20180125
MDC IDC LEAD LOCATION: 753860
MDC IDC LEAD SERIAL: 693139
MDC IDC LEAD SERIAL: 851154
Pulse Gen Serial Number: 773204

## 2018-03-07 NOTE — Progress Notes (Signed)
Remote pacemaker transmission.   

## 2018-06-06 ENCOUNTER — Other Ambulatory Visit: Payer: Self-pay

## 2018-06-06 ENCOUNTER — Encounter: Payer: Medicare Other | Admitting: *Deleted

## 2018-06-07 ENCOUNTER — Telehealth: Payer: Self-pay

## 2018-06-07 NOTE — Telephone Encounter (Signed)
Spoke with patient to remind of missed remote transmission 

## 2018-06-14 ENCOUNTER — Encounter: Payer: Self-pay | Admitting: Cardiology

## 2018-06-17 ENCOUNTER — Other Ambulatory Visit: Payer: Self-pay

## 2018-06-17 ENCOUNTER — Ambulatory Visit (INDEPENDENT_AMBULATORY_CARE_PROVIDER_SITE_OTHER): Payer: Medicare Other | Admitting: *Deleted

## 2018-06-17 DIAGNOSIS — I441 Atrioventricular block, second degree: Secondary | ICD-10-CM

## 2018-06-17 LAB — CUP PACEART REMOTE DEVICE CHECK
Battery Remaining Longevity: 150 mo
Battery Remaining Percentage: 100 %
Brady Statistic RA Percent Paced: 19 %
Brady Statistic RV Percent Paced: 100 %
Date Time Interrogation Session: 20200412192800
Implantable Lead Implant Date: 20180125
Implantable Lead Implant Date: 20180125
Implantable Lead Location: 753859
Implantable Lead Location: 753860
Implantable Lead Model: 7740
Implantable Lead Model: 7741
Implantable Lead Serial Number: 693139
Implantable Lead Serial Number: 851154
Implantable Pulse Generator Implant Date: 20180125
Lead Channel Impedance Value: 657 Ohm
Lead Channel Impedance Value: 844 Ohm
Lead Channel Pacing Threshold Amplitude: 0.4 V
Lead Channel Pacing Threshold Amplitude: 1.1 V
Lead Channel Pacing Threshold Pulse Width: 0.4 ms
Lead Channel Pacing Threshold Pulse Width: 0.4 ms
Lead Channel Setting Pacing Amplitude: 2 V
Lead Channel Setting Pacing Amplitude: 2.5 V
Lead Channel Setting Pacing Pulse Width: 0.4 ms
Lead Channel Setting Sensing Sensitivity: 2.5 mV
Pulse Gen Serial Number: 773204

## 2018-06-27 ENCOUNTER — Encounter: Payer: Self-pay | Admitting: Cardiology

## 2018-06-27 NOTE — Progress Notes (Signed)
Remote pacemaker transmission.   

## 2018-08-16 ENCOUNTER — Other Ambulatory Visit: Payer: Self-pay | Admitting: Nurse Practitioner

## 2018-08-16 ENCOUNTER — Other Ambulatory Visit (HOSPITAL_COMMUNITY): Payer: Self-pay | Admitting: Nurse Practitioner

## 2018-08-19 ENCOUNTER — Other Ambulatory Visit: Payer: Self-pay | Admitting: Nurse Practitioner

## 2018-08-19 DIAGNOSIS — E0590 Thyrotoxicosis, unspecified without thyrotoxic crisis or storm: Secondary | ICD-10-CM

## 2018-08-26 ENCOUNTER — Ambulatory Visit
Admission: RE | Admit: 2018-08-26 | Discharge: 2018-08-26 | Disposition: A | Discharge status: Home / Self Care | Payer: Medicare Other | Source: Ambulatory Visit | Attending: Nurse Practitioner | Admitting: Nurse Practitioner

## 2018-08-26 ENCOUNTER — Other Ambulatory Visit: Payer: Self-pay

## 2018-08-26 DIAGNOSIS — E0590 Thyrotoxicosis, unspecified without thyrotoxic crisis or storm: Secondary | ICD-10-CM

## 2018-09-03 ENCOUNTER — Encounter: Payer: Self-pay | Admitting: Gastroenterology

## 2018-09-16 ENCOUNTER — Other Ambulatory Visit: Payer: Self-pay

## 2018-09-16 ENCOUNTER — Telehealth: Payer: Medicare Other | Admitting: Gastroenterology

## 2018-09-16 ENCOUNTER — Ambulatory Visit (INDEPENDENT_AMBULATORY_CARE_PROVIDER_SITE_OTHER): Payer: Medicare Other | Admitting: *Deleted

## 2018-09-16 DIAGNOSIS — I441 Atrioventricular block, second degree: Secondary | ICD-10-CM | POA: Diagnosis not present

## 2018-09-17 ENCOUNTER — Telehealth: Payer: Self-pay

## 2018-09-17 NOTE — Telephone Encounter (Signed)
Spoke with patient to remind of missed remote transmission 

## 2018-09-18 LAB — CUP PACEART REMOTE DEVICE CHECK
Battery Remaining Longevity: 150 mo
Battery Remaining Percentage: 100 %
Brady Statistic RA Percent Paced: 20 %
Brady Statistic RV Percent Paced: 100 %
Date Time Interrogation Session: 20200714201000
Implantable Lead Implant Date: 20180125
Implantable Lead Implant Date: 20180125
Implantable Lead Location: 753859
Implantable Lead Location: 753860
Implantable Lead Model: 7740
Implantable Lead Model: 7741
Implantable Lead Serial Number: 693139
Implantable Lead Serial Number: 851154
Implantable Pulse Generator Implant Date: 20180125
Lead Channel Impedance Value: 684 Ohm
Lead Channel Impedance Value: 752 Ohm
Lead Channel Pacing Threshold Amplitude: 0.4 V
Lead Channel Pacing Threshold Amplitude: 1 V
Lead Channel Pacing Threshold Pulse Width: 0.4 ms
Lead Channel Pacing Threshold Pulse Width: 0.4 ms
Lead Channel Setting Pacing Amplitude: 2 V
Lead Channel Setting Pacing Amplitude: 2.5 V
Lead Channel Setting Pacing Pulse Width: 0.4 ms
Lead Channel Setting Sensing Sensitivity: 2.5 mV
Pulse Gen Serial Number: 773204

## 2018-09-30 NOTE — Progress Notes (Signed)
Remote pacemaker transmission.   

## 2018-12-17 ENCOUNTER — Ambulatory Visit (INDEPENDENT_AMBULATORY_CARE_PROVIDER_SITE_OTHER): Payer: Medicare Other | Admitting: *Deleted

## 2018-12-17 DIAGNOSIS — I441 Atrioventricular block, second degree: Secondary | ICD-10-CM | POA: Diagnosis not present

## 2018-12-18 LAB — CUP PACEART REMOTE DEVICE CHECK
Battery Remaining Longevity: 138 mo
Battery Remaining Percentage: 100 %
Brady Statistic RA Percent Paced: 22 %
Brady Statistic RV Percent Paced: 100 %
Date Time Interrogation Session: 20201013064100
Implantable Lead Implant Date: 20180125
Implantable Lead Implant Date: 20180125
Implantable Lead Location: 753859
Implantable Lead Location: 753860
Implantable Lead Model: 7740
Implantable Lead Model: 7741
Implantable Lead Serial Number: 693139
Implantable Lead Serial Number: 851154
Implantable Pulse Generator Implant Date: 20180125
Lead Channel Impedance Value: 651 Ohm
Lead Channel Impedance Value: 680 Ohm
Lead Channel Pacing Threshold Amplitude: 0.4 V
Lead Channel Pacing Threshold Amplitude: 1.1 V
Lead Channel Pacing Threshold Pulse Width: 0.4 ms
Lead Channel Pacing Threshold Pulse Width: 0.4 ms
Lead Channel Setting Pacing Amplitude: 2 V
Lead Channel Setting Pacing Amplitude: 2.5 V
Lead Channel Setting Pacing Pulse Width: 0.4 ms
Lead Channel Setting Sensing Sensitivity: 2.5 mV
Pulse Gen Serial Number: 773204

## 2018-12-30 NOTE — Progress Notes (Signed)
Remote pacemaker transmission.   

## 2019-03-18 ENCOUNTER — Ambulatory Visit (INDEPENDENT_AMBULATORY_CARE_PROVIDER_SITE_OTHER): Payer: Medicare PPO | Admitting: *Deleted

## 2019-03-18 DIAGNOSIS — I441 Atrioventricular block, second degree: Secondary | ICD-10-CM

## 2019-03-18 LAB — CUP PACEART REMOTE DEVICE CHECK
Battery Remaining Longevity: 132 mo
Battery Remaining Percentage: 100 %
Brady Statistic RA Percent Paced: 23 %
Brady Statistic RV Percent Paced: 100 %
Date Time Interrogation Session: 20210112024100
Implantable Lead Implant Date: 20180125
Implantable Lead Implant Date: 20180125
Implantable Lead Location: 753859
Implantable Lead Location: 753860
Implantable Lead Model: 7740
Implantable Lead Model: 7741
Implantable Lead Serial Number: 693139
Implantable Lead Serial Number: 851154
Implantable Pulse Generator Implant Date: 20180125
Lead Channel Impedance Value: 636 Ohm
Lead Channel Impedance Value: 675 Ohm
Lead Channel Pacing Threshold Amplitude: 0.4 V
Lead Channel Pacing Threshold Amplitude: 1.2 V
Lead Channel Pacing Threshold Pulse Width: 0.4 ms
Lead Channel Pacing Threshold Pulse Width: 0.4 ms
Lead Channel Setting Pacing Amplitude: 2 V
Lead Channel Setting Pacing Amplitude: 2.5 V
Lead Channel Setting Pacing Pulse Width: 0.4 ms
Lead Channel Setting Sensing Sensitivity: 2.5 mV
Pulse Gen Serial Number: 773204

## 2019-06-17 ENCOUNTER — Ambulatory Visit (INDEPENDENT_AMBULATORY_CARE_PROVIDER_SITE_OTHER): Payer: Medicare PPO | Admitting: *Deleted

## 2019-06-17 DIAGNOSIS — I441 Atrioventricular block, second degree: Secondary | ICD-10-CM

## 2019-06-17 LAB — CUP PACEART REMOTE DEVICE CHECK
Battery Remaining Longevity: 132 mo
Battery Remaining Percentage: 100 %
Brady Statistic RA Percent Paced: 24 %
Brady Statistic RV Percent Paced: 100 %
Date Time Interrogation Session: 20210413064700
Implantable Lead Implant Date: 20180125
Implantable Lead Implant Date: 20180125
Implantable Lead Location: 753859
Implantable Lead Location: 753860
Implantable Lead Model: 7740
Implantable Lead Model: 7741
Implantable Lead Serial Number: 693139
Implantable Lead Serial Number: 851154
Implantable Pulse Generator Implant Date: 20180125
Lead Channel Impedance Value: 603 Ohm
Lead Channel Impedance Value: 620 Ohm
Lead Channel Pacing Threshold Amplitude: 0.4 V
Lead Channel Pacing Threshold Amplitude: 1.3 V
Lead Channel Pacing Threshold Pulse Width: 0.4 ms
Lead Channel Pacing Threshold Pulse Width: 0.4 ms
Lead Channel Setting Pacing Amplitude: 2 V
Lead Channel Setting Pacing Amplitude: 2.5 V
Lead Channel Setting Pacing Pulse Width: 0.4 ms
Lead Channel Setting Sensing Sensitivity: 2.5 mV
Pulse Gen Serial Number: 773204

## 2019-06-18 NOTE — Progress Notes (Signed)
PPM Remote  

## 2019-09-05 ENCOUNTER — Other Ambulatory Visit: Payer: Self-pay

## 2019-09-05 ENCOUNTER — Encounter: Payer: Self-pay | Admitting: Internal Medicine

## 2019-09-05 ENCOUNTER — Ambulatory Visit: Payer: Medicare PPO | Admitting: Internal Medicine

## 2019-09-05 VITALS — BP 180/94 | HR 60 | Ht 67.0 in | Wt 153.8 lb

## 2019-09-05 DIAGNOSIS — Z950 Presence of cardiac pacemaker: Secondary | ICD-10-CM | POA: Diagnosis not present

## 2019-09-05 DIAGNOSIS — I441 Atrioventricular block, second degree: Secondary | ICD-10-CM | POA: Diagnosis not present

## 2019-09-05 DIAGNOSIS — I10 Essential (primary) hypertension: Secondary | ICD-10-CM | POA: Diagnosis not present

## 2019-09-05 MED ORDER — AMLODIPINE BESYLATE 5 MG PO TABS
5.0000 mg | ORAL_TABLET | Freq: Every day | ORAL | 3 refills | Status: DC
Start: 2019-09-05 — End: 2020-11-18

## 2019-09-05 NOTE — Patient Instructions (Addendum)
Medication Instructions:  Your physician has recommended you make the following change in your medication:  1 Start Amlodipine (5 mg) 1 tablet by mouth daily   *If you need a refill on your cardiac medications before your next appointment, please call your pharmacy*  Lab Work: None ordered.  If you have labs (blood work) drawn today and your tests are completely normal, you will receive your results only by: [name] MyChart Message (if you have MyChart) OR . A paper copy in the mail If you have any lab test that is abnormal or we need to change your treatment, we will call you to review the results.  Testing/Procedures: None ordered.  Follow-Up: At [facility], you and your health needs are our priority.  As part of our continuing mission to provide you with exceptional heart care, we have created designated Provider Care Teams.  These Care Teams include your primary Cardiologist (physician) and Advanced Practice Providers (APPs -  Physician Assistants and Nurse Practitioners) who all work together to provide you with the care you need, when you need it.  We recommend signing up for the patient portal called "MyChart".  Sign up information is provided on this After Visit Summary.  MyChart is used to connect with patients for Virtual Visits (Telemedicine).  Patients are able to view lab/test results, encounter notes, upcoming appointments, etc.  Non-urgent messages can be sent to your provider as well.   To learn more about what you can do with MyChart, go to [email or web address].    Your next appointment:   Your physician wants you to follow-up in: 1 year with Lovena Le or APP.  You will receive a reminder letter in the mail two months in advance. If you don't receive a letter, please call our office to schedule the follow-up appointment.  Remote monitoring is used to monitor your Pacemaker from home. This monitoring reduces the number of office visits required to check your device to one time  per year. It allows [place] to keep an eye on the functioning of your device to ensure it is working properly. You are scheduled for a device check from home on 09/16/2019. You may send your transmission at any time that day. If you have a wireless device, the transmission will be sent automatically. After your physician reviews your transmission, you will receive a postcard with your next transmission date.  Other Instructions:

## 2019-09-05 NOTE — Progress Notes (Signed)
HPI Phillip Duran returns today for followup. He is a pleasant 74 yo man with CHB and HTN. He has not been going to see his primary care MD. He denies chest pain or sob. No edema. He has been active remodeling his [place]. He had knee surgery. He does not check his bp regularly.  Allergies  Allergen Reactions  . Morphine And Related Nausea And Vomiting  . Other Other (See Comments)    Anesthesia--difficulty waking patient     Current Outpatient Medications  Medication Sig Dispense Refill  . amLODipine (NORVASC) 5 MG tablet Take 1 tablet (5 mg total) by mouth daily. 90 tablet 3   No current facility-administered medications for this visit.     Past Medical History:  Diagnosis Date  . Anxiety   . Arthritis   . Complication of anesthesia    "hard time waking up"  . Depression   . Diverticulitis   . Dysrhythmia    2:1 AV Block - pacemaker insertion by Dr. Lovena Le   . Headache   . Hypertension   . Pneumonia    aspiration pneumonia  . Presence of permanent cardiac pacemaker    managed by Dr. Lovena Le, [facility]  . Prostate cancer ([place])     ROS:   All systems reviewed and negative except as noted in the HPI.   Past Surgical History:  Procedure Laterality Date  . APPENDECTOMY    . BACK SURGERY    . CARDIAC CATHETERIZATION     in [place], [place] in the 1990's  . COLONOSCOPY  03/30/2010   Moderate to severe sigmoid diverticulosis. Small internal hemorrhoids. Otherwise normal colonoscopy to TI.   [name] EP IMPLANTABLE DEVICE N/A 03/30/2016   Procedure: Pacemaker Implant;  Surgeon: Evans Lance, MD;  Location: [place] [facility];  Service: Cardiovascular;  Laterality: N/A;  . HERNIA REPAIR     x3  . KNEE ARTHROSCOPY Bilateral   . NASAL SINUS SURGERY     eye patch on left side  . TOTAL KNEE ARTHROPLASTY Right 05/21/2017   Procedure: RIGHT TOTAL KNEE ARTHROPLASTY;  Surgeon: Vickey Huger, MD;  Location: [place];  Service: Orthopedics;  Laterality: Right;  [name] VASECTOMY        Family History  Problem Relation Age of Onset  . Stroke Mother   . Hypertension Father   . Stroke Sister   . Stroke Sister      Social History   Socioeconomic History  . Marital status: Married    Spouse name: Not on file  . Number of children: Not on file  . Years of education: Not on file  . Highest education level: Not on file  Occupational History  . Not on file  Tobacco Use  . Smoking status: Never Smoker  . Smokeless tobacco: Never Used  Vaping Use  . Vaping Use: Never used  Substance and Sexual Activity  . Alcohol use: Yes    Alcohol/week: 3.0 standard drinks    Types: 3 Cans of beer per week  . Drug use: No  . Sexual activity: Not on file  Other Topics Concern  . Not on file  Social History Narrative  . Not on file   Social Determinants of Health   Financial Resource Strain:   . Difficulty of Paying Living Expenses:   Food Insecurity:   . Worried About Charity fundraiser in the Last Year:   . [place] in the Last Year:  Transportation Needs:   . Film/video editor (Medical):   [name] Lack of Transportation (Non-Medical):   Physical Activity:   . Days of Exercise per Week:   . Minutes of Exercise per Session:   Stress:   . Feeling of Stress :   Social Connections:   . Frequency of Communication with Friends and Family:   . Frequency of Social Gatherings with Friends and Family:   . Attends Religious Services:   . Active Member of Clubs or Organizations:   . Attends Archivist Meetings:   [name] Marital Status:   Intimate Partner Violence:   . Fear of Current or Ex-Partner:   . Emotionally Abused:   [name] Physically Abused:   . Sexually Abused:      BP (!) 180/94   Pulse 60   Ht 5\' 7"  (1.702 m)   Wt 153 lb 12.8 oz (69.8 kg)   SpO2 96%   BMI 24.09 kg/m   Physical Exam:  Well appearing NAD HEENT: Unremarkable Neck:  No JVD, no thyromegally Lymphatics:  No adenopathy Back:  No CVA tenderness Lungs:  Clear with no  wheezes HEART:  Regular rate rhythm, no murmurs, no rubs, no clicks Abd:  soft, positive bowel sounds, no organomegally, no rebound, no guarding Ext:  2 plus pulses, no edema, no cyanosis, no clubbing Skin:  No rashes no nodules Neuro:  CN II through XII intact, motor grossly intact  EKG - nsr with AV pacing  DEVICE  Normal device function.  See PaceArt for details.   Assess/Plan: 1. Uncontrolled HTN - on my exam, his bp is 170/90. I have asked him to start amlodipine 5 mg daily. 2. CHB - he is asymptomatic, s/p PPM insertion.  3. PPM - his [employer] DDD PM is working normally. We will recheck in several months.  [name].D.

## 2019-09-16 ENCOUNTER — Ambulatory Visit (INDEPENDENT_AMBULATORY_CARE_PROVIDER_SITE_OTHER): Payer: Medicare PPO | Admitting: *Deleted

## 2019-09-16 DIAGNOSIS — R001 Bradycardia, unspecified: Secondary | ICD-10-CM | POA: Diagnosis not present

## 2019-09-19 LAB — CUP PACEART REMOTE DEVICE CHECK
Battery Remaining Longevity: 126 mo
Battery Remaining Percentage: 100 %
Brady Statistic RA Percent Paced: 23 %
Brady Statistic RV Percent Paced: 100 %
Date Time Interrogation Session: 20210715105000
Implantable Lead Implant Date: 20180125
Implantable Lead Implant Date: 20180125
Implantable Lead Location: 753859
Implantable Lead Location: 753860
Implantable Lead Model: 7740
Implantable Lead Model: 7741
Implantable Lead Serial Number: 693139
Implantable Lead Serial Number: 851154
Implantable Pulse Generator Implant Date: 20180125
Lead Channel Impedance Value: 579 Ohm
Lead Channel Impedance Value: 581 Ohm
Lead Channel Pacing Threshold Amplitude: 0.4 V
Lead Channel Pacing Threshold Amplitude: 1.4 V
Lead Channel Pacing Threshold Pulse Width: 0.4 ms
Lead Channel Pacing Threshold Pulse Width: 0.4 ms
Lead Channel Setting Pacing Amplitude: 2 V
Lead Channel Setting Pacing Amplitude: 2.5 V
Lead Channel Setting Pacing Pulse Width: 0.4 ms
Lead Channel Setting Sensing Sensitivity: 2.5 mV
Pulse Gen Serial Number: 773204

## 2019-09-23 NOTE — Progress Notes (Signed)
Remote pacemaker transmission.   

## 2019-12-16 ENCOUNTER — Ambulatory Visit (INDEPENDENT_AMBULATORY_CARE_PROVIDER_SITE_OTHER): Payer: Medicare PPO

## 2019-12-16 DIAGNOSIS — I441 Atrioventricular block, second degree: Secondary | ICD-10-CM

## 2019-12-20 LAB — CUP PACEART REMOTE DEVICE CHECK
Battery Remaining Longevity: 126 mo
Battery Remaining Percentage: 100 %
Brady Statistic RA Percent Paced: 30 %
Brady Statistic RV Percent Paced: 100 %
Date Time Interrogation Session: 20211014194100
Implantable Lead Implant Date: 20180125
Implantable Lead Implant Date: 20180125
Implantable Lead Location: 753859
Implantable Lead Location: 753860
Implantable Lead Model: 7740
Implantable Lead Model: 7741
Implantable Lead Serial Number: 693139
Implantable Lead Serial Number: 851154
Implantable Pulse Generator Implant Date: 20180125
Lead Channel Impedance Value: 571 Ohm
Lead Channel Impedance Value: 571 Ohm
Lead Channel Pacing Threshold Amplitude: 0.4 V
Lead Channel Pacing Threshold Amplitude: 1.4 V
Lead Channel Pacing Threshold Pulse Width: 0.4 ms
Lead Channel Pacing Threshold Pulse Width: 0.4 ms
Lead Channel Setting Pacing Amplitude: 2 V
Lead Channel Setting Pacing Amplitude: 2.5 V
Lead Channel Setting Pacing Pulse Width: 0.4 ms
Lead Channel Setting Sensing Sensitivity: 2.5 mV
Pulse Gen Serial Number: 773204

## 2019-12-22 NOTE — Progress Notes (Signed)
Remote pacemaker transmission.   

## 2020-03-16 ENCOUNTER — Ambulatory Visit (INDEPENDENT_AMBULATORY_CARE_PROVIDER_SITE_OTHER): Payer: Medicare PPO

## 2020-03-16 DIAGNOSIS — I441 Atrioventricular block, second degree: Secondary | ICD-10-CM

## 2020-03-17 LAB — CUP PACEART REMOTE DEVICE CHECK
Battery Remaining Longevity: 120 mo
Battery Remaining Percentage: 100 %
Brady Statistic RA Percent Paced: 32 %
Brady Statistic RV Percent Paced: 100 %
Date Time Interrogation Session: 20220111024100
Implantable Lead Implant Date: 20180125
Implantable Lead Implant Date: 20180125
Implantable Lead Location: 753859
Implantable Lead Location: 753860
Implantable Lead Model: 7740
Implantable Lead Model: 7741
Implantable Lead Serial Number: 693139
Implantable Lead Serial Number: 851154
Implantable Pulse Generator Implant Date: 20180125
Lead Channel Impedance Value: 602 Ohm
Lead Channel Impedance Value: 623 Ohm
Lead Channel Pacing Threshold Amplitude: 0.4 V
Lead Channel Pacing Threshold Amplitude: 1.6 V
Lead Channel Pacing Threshold Pulse Width: 0.4 ms
Lead Channel Pacing Threshold Pulse Width: 0.4 ms
Lead Channel Setting Pacing Amplitude: 2 V
Lead Channel Setting Pacing Amplitude: 2.5 V
Lead Channel Setting Pacing Pulse Width: 0.4 ms
Lead Channel Setting Sensing Sensitivity: 2.5 mV
Pulse Gen Serial Number: 773204

## 2020-04-02 NOTE — Progress Notes (Signed)
Remote pacemaker transmission.   

## 2020-06-15 ENCOUNTER — Ambulatory Visit (INDEPENDENT_AMBULATORY_CARE_PROVIDER_SITE_OTHER): Payer: Medicare PPO

## 2020-06-15 DIAGNOSIS — I441 Atrioventricular block, second degree: Secondary | ICD-10-CM

## 2020-06-17 LAB — CUP PACEART REMOTE DEVICE CHECK
Battery Remaining Longevity: 120 mo
Battery Remaining Percentage: 100 %
Brady Statistic RA Percent Paced: 30 %
Brady Statistic RV Percent Paced: 100 %
Date Time Interrogation Session: 20220413153100
Implantable Lead Implant Date: 20180125
Implantable Lead Implant Date: 20180125
Implantable Lead Location: 753859
Implantable Lead Location: 753860
Implantable Lead Model: 7740
Implantable Lead Model: 7741
Implantable Lead Serial Number: 693139
Implantable Lead Serial Number: 851154
Implantable Pulse Generator Implant Date: 20180125
Lead Channel Impedance Value: 554 Ohm
Lead Channel Impedance Value: 567 Ohm
Lead Channel Pacing Threshold Amplitude: 0.4 V
Lead Channel Pacing Threshold Amplitude: 1.6 V
Lead Channel Pacing Threshold Pulse Width: 0.4 ms
Lead Channel Pacing Threshold Pulse Width: 0.4 ms
Lead Channel Setting Pacing Amplitude: 2 V
Lead Channel Setting Pacing Amplitude: 2.5 V
Lead Channel Setting Pacing Pulse Width: 0.4 ms
Lead Channel Setting Sensing Sensitivity: 2.5 mV
Pulse Gen Serial Number: 773204

## 2020-06-30 NOTE — Progress Notes (Signed)
Remote pacemaker transmission.   

## 2020-08-06 IMAGING — US US THYROID
1 series · 14 of 25 positions shown · non-contrast
Comparison: None.

CLINICAL DATA: Hyperthyroid.

EXAM:
THYROID ULTRASOUND
TECHNIQUE: Ultrasound examination of the thyroid gland and adjacent soft
tissues was performed.

[Series 1: us thyroid · 14 of 38 slices shown]
[im 1/38]
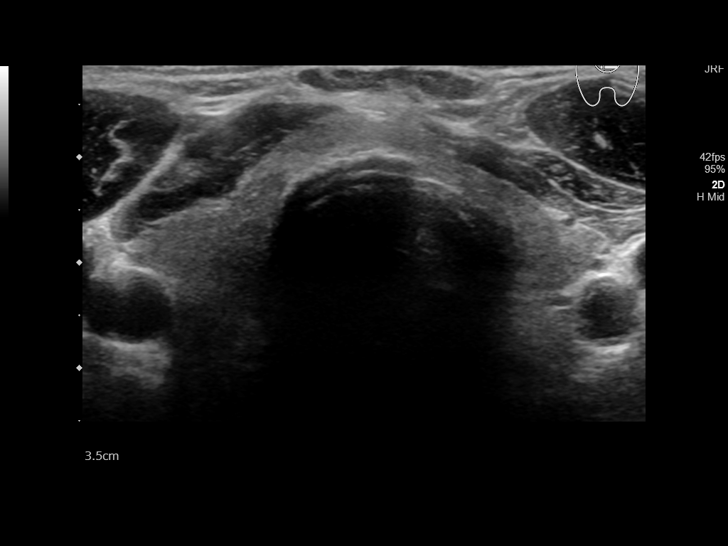
[im 4/38]
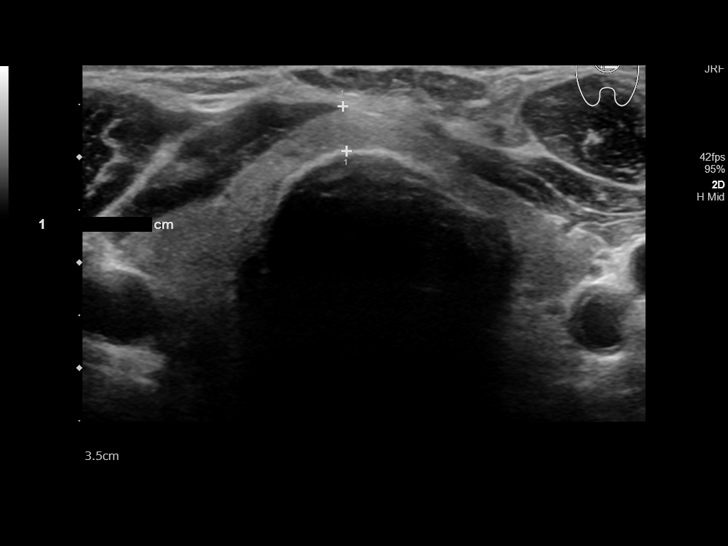
[im 7/38]
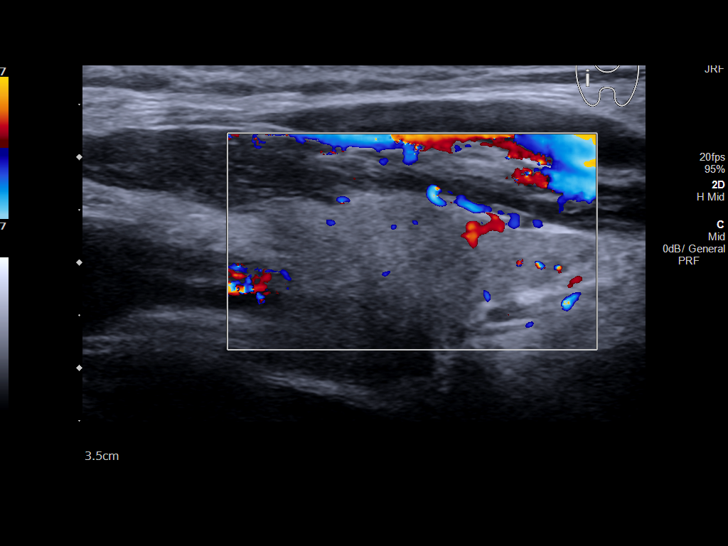
[im 10/38]
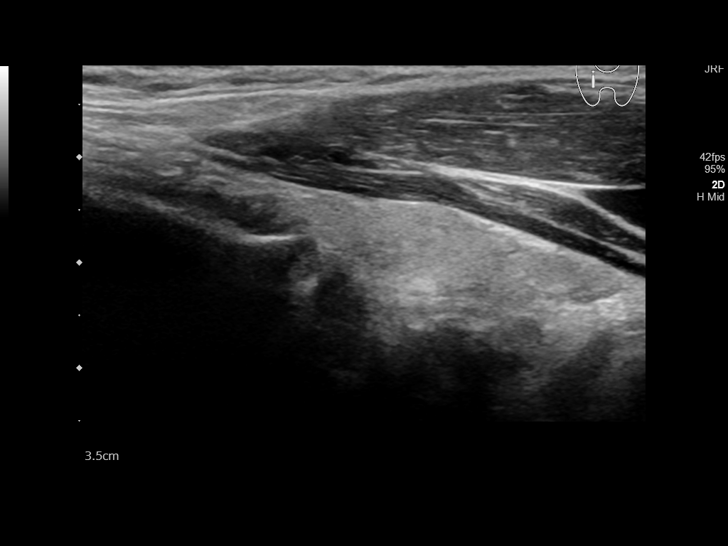
[im 13/38]
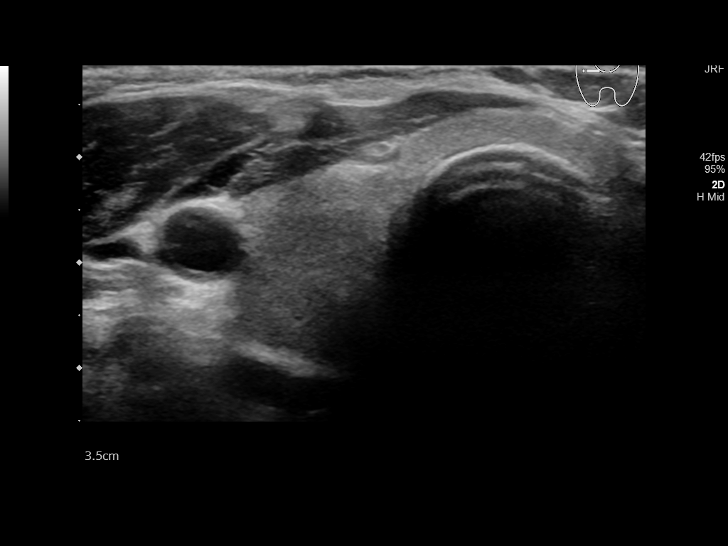
[im 14/38]
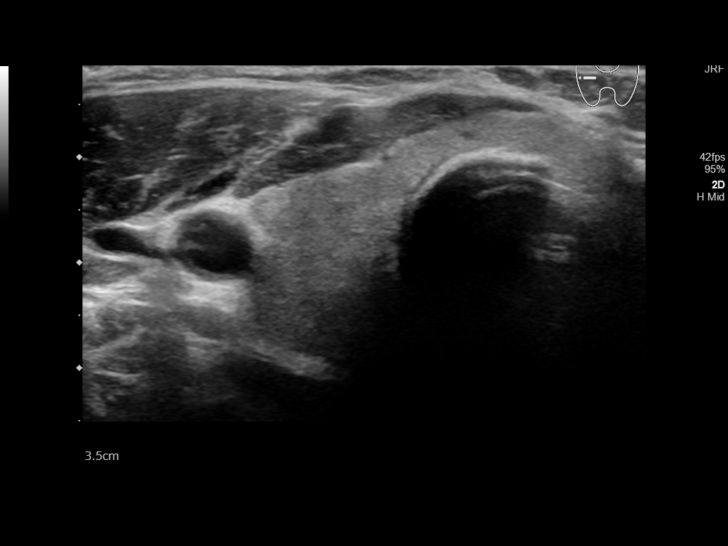
[im 17/38]
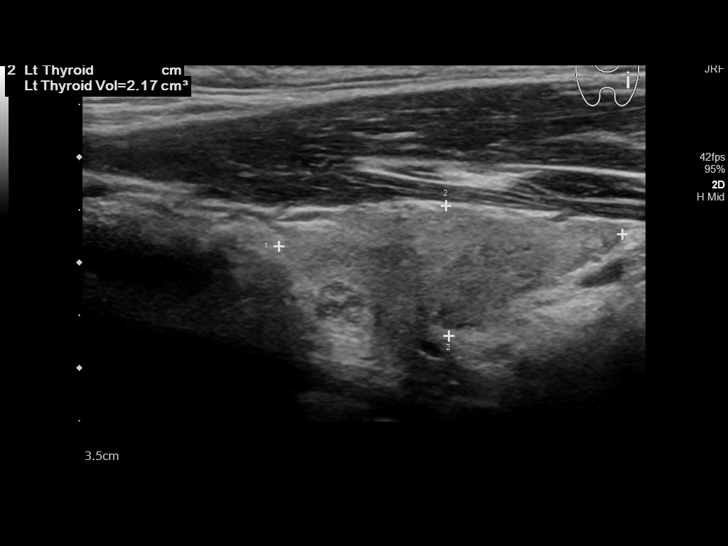
[im 21/38]
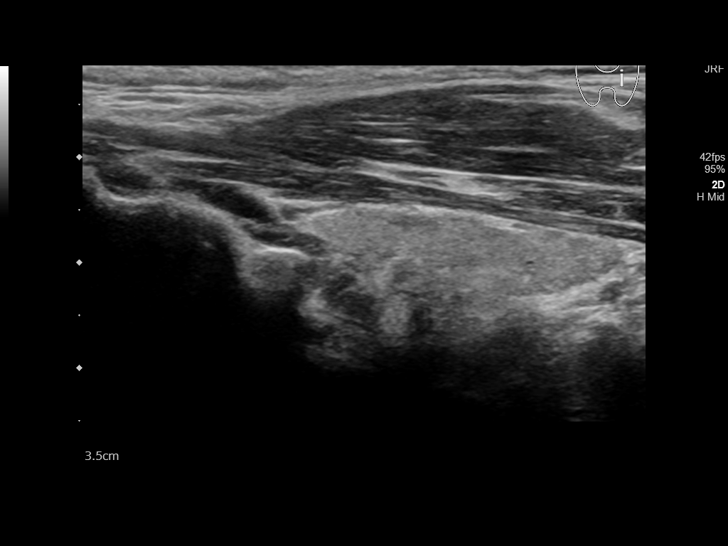
[im 24/38]
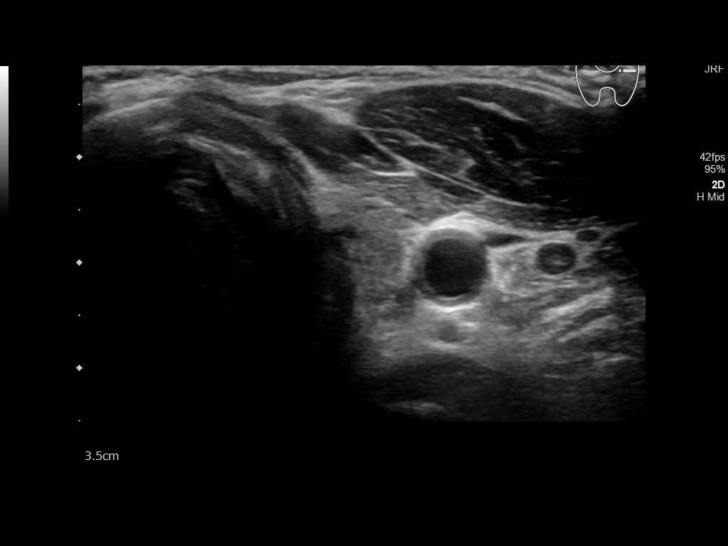
[im 25/38]
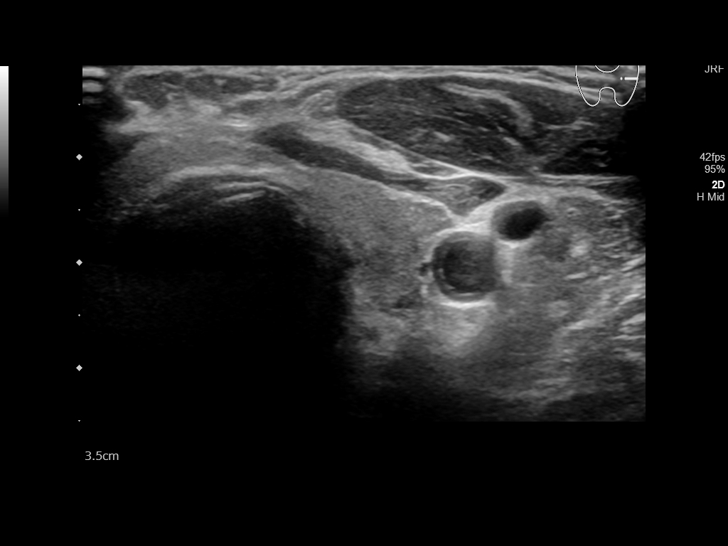
[im 28/38]
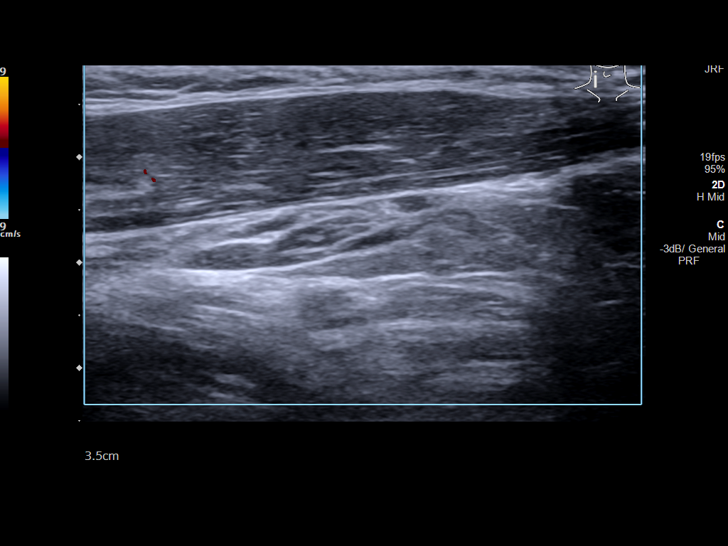
[im 31/38]
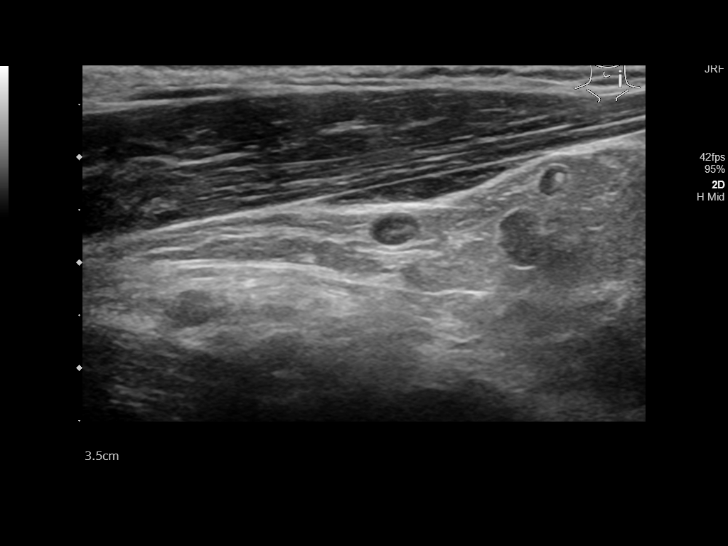
[im 34/38]
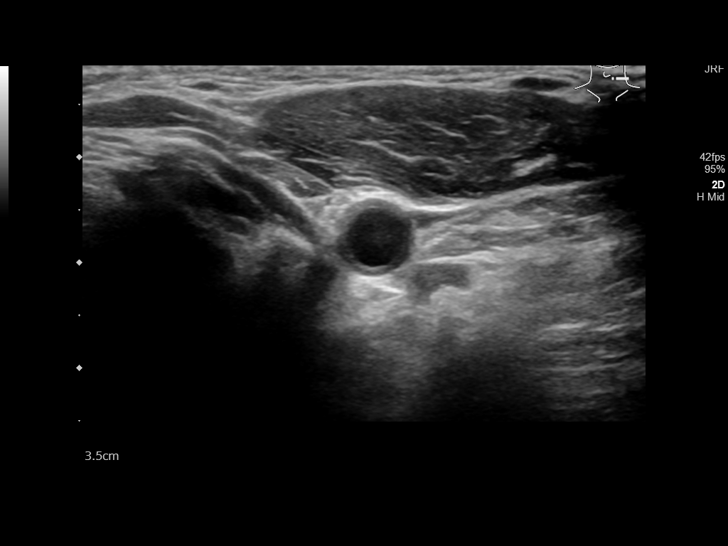
[im 38/38]
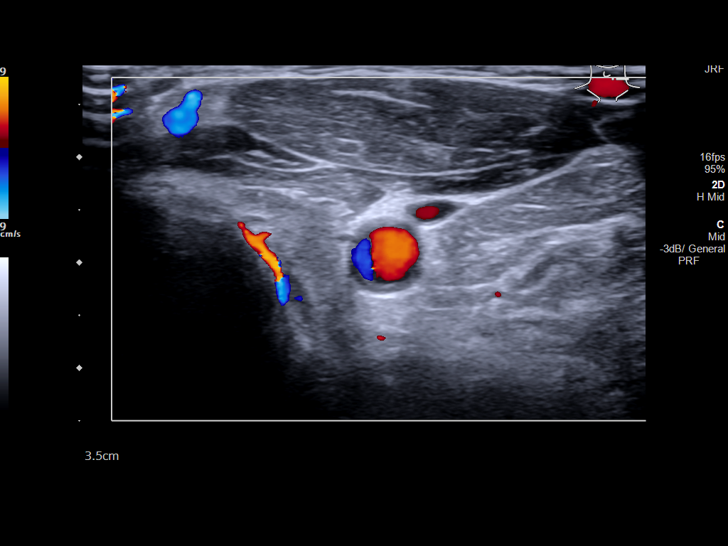

[14 of 25 positions shown; findings below may reference images not displayed]

FINDINGS: Parenchymal Echotexture: Moderately heterogenous - no definitive
glandular hyperemia.

Isthmus: Normal in size measuring 0.4 cm in diameter

Right lobe: Slightly diminutive in size measuring 3.3 x 1.4 x 1.2 cm

Left lobe: There is slightly diminutive in size measuring 3.3 x
x 1.1 cm.

_________________________________________________________

Estimated total number of nodules >/= 1 cm: 0

Number of spongiform nodules >/=  2 cm not described below (TR1): 0

Number of mixed cystic and solid nodules >/= 1.5 cm not described
below (TR2): 0

_________________________________________________________

No discrete nodules are seen within the thyroid gland.

No regional bulky cervical lymphadenopathy.
IMPRESSION: Slightly diminutive and moderately heterogeneous appearing thyroid
without discrete nodule or mass. Findings are nonspecific though
could be seen in the setting of a chronic thyroiditis.

## 2020-09-14 ENCOUNTER — Ambulatory Visit (INDEPENDENT_AMBULATORY_CARE_PROVIDER_SITE_OTHER): Payer: Medicare PPO

## 2020-09-14 DIAGNOSIS — I441 Atrioventricular block, second degree: Secondary | ICD-10-CM

## 2020-09-17 LAB — CUP PACEART REMOTE DEVICE CHECK
Battery Remaining Longevity: 114 mo
Battery Remaining Percentage: 100 %
Brady Statistic RA Percent Paced: 30 %
Brady Statistic RV Percent Paced: 100 %
Date Time Interrogation Session: 20220715075400
Implantable Lead Implant Date: 20180125
Implantable Lead Implant Date: 20180125
Implantable Lead Location: 753859
Implantable Lead Location: 753860
Implantable Lead Model: 7740
Implantable Lead Model: 7741
Implantable Lead Serial Number: 693139
Implantable Lead Serial Number: 851154
Implantable Pulse Generator Implant Date: 20180125
Lead Channel Impedance Value: 566 Ohm
Lead Channel Impedance Value: 601 Ohm
Lead Channel Pacing Threshold Amplitude: 0.4 V
Lead Channel Pacing Threshold Amplitude: 1.6 V
Lead Channel Pacing Threshold Pulse Width: 0.4 ms
Lead Channel Pacing Threshold Pulse Width: 0.4 ms
Lead Channel Setting Pacing Amplitude: 2 V
Lead Channel Setting Pacing Amplitude: 2.5 V
Lead Channel Setting Pacing Pulse Width: 0.4 ms
Lead Channel Setting Sensing Sensitivity: 2.5 mV
Pulse Gen Serial Number: 773204

## 2020-10-07 NOTE — Progress Notes (Signed)
Remote pacemaker transmission.   

## 2020-11-17 NOTE — Progress Notes (Signed)
Electrophysiology Office Note Date: 11/18/2020  ID:  Phillip Duran, DOB 1946-01-09, MRN [id number]  PCP: Charlynn Court, NP Primary Cardiologist: None Electrophysiologist: [name], MD   CC: Pacemaker follow-up  Phillip Duran is a 75 y.o. male seen today for [name], MD for routine electrophysiology followup.  Since last being seen in our clinic the patient reports doing very well. BP continues to "run high" at home. Took himself off amlodipine because he doesn't like it. Had recent physical through [facility] and states everything "looked good".  he denies chest pain, palpitations, dyspnea, PND, orthopnea, nausea, vomiting, dizziness, syncope, edema, weight gain, or early satiety.  Device History: Engineer, agricultural PPM implanted 03/2016 for Second degre AV block  Past Medical History:  Diagnosis Date   Anxiety    Arthritis    Complication of anesthesia    "hard time waking up"   Depression    Diverticulitis    Dysrhythmia    2:1 AV Block - pacemaker insertion by Dr. Lovena Le    Headache    Hypertension    Pneumonia    aspiration pneumonia   Presence of permanent cardiac pacemaker    managed by Dr. Lovena Le, [facility]   Prostate cancer [facility])    Past Surgical History:  Procedure Laterality Date   APPENDECTOMY     BACK [place]     in [place], [place] in the 1990's   COLONOSCOPY  03/30/2010   Moderate to severe sigmoid diverticulosis. Small internal hemorrhoids. Otherwise normal colonoscopy to TI.    EP IMPLANTABLE DEVICE N/A 03/30/2016   Procedure: Pacemaker Implant;  Surgeon: Evans Lance, MD;  Location: [place] [facility];  Service: Cardiovascular;  Laterality: N/A;   HERNIA REPAIR     x3   KNEE ARTHROSCOPY Bilateral    NASAL SINUS SURGERY     eye patch on left side   TOTAL KNEE ARTHROPLASTY Right 05/21/2017   Procedure: RIGHT TOTAL KNEE ARTHROPLASTY;  Surgeon: Vickey Huger, MD;  Location: [place];  Service:  Orthopedics;  Laterality: Right;   VASECTOMY      Current Outpatient Medications  Medication Sig Dispense Refill   amLODipine (NORVASC) 5 MG tablet Take 1 tablet (5 mg total) by mouth daily. (Patient not taking: Reported on 11/18/2020) 90 tablet 3   No current facility-administered medications for this visit.    Allergies:   Morphine and related and Other   Social History: Social History   Socioeconomic History   Marital status: Married    Spouse name: Not on file   Number of children: Not on file   Years of education: Not on file   Highest education level: Not on file  Occupational History   Not on file  Tobacco Use   Smoking status: Never   Smokeless tobacco: Never  Vaping Use   Vaping Use: Never used  Substance and Sexual Activity   Alcohol use: Yes    Alcohol/week: 3.0 standard drinks    Types: 3 Cans of beer per week   Drug use: No   Sexual activity: Not on file  Other Topics Concern   Not on file  Social History Narrative   Not on file   Social Determinants of Health   Financial Resource Strain: Not on file  Food Insecurity: Not on file  Transportation Needs: Not on file  Physical Activity: Not on file  Stress: Not on file  Social Connections: Not on file  Intimate Partner Violence: Not on file    Family History: Family History  Problem Relation Age of Onset   Stroke Mother    Hypertension Father    Stroke Sister    Stroke Sister      Review of Systems: All other systems reviewed and are otherwise negative except as noted above.  Physical Exam: Vitals:   11/18/20 0904  BP: (!) 150/100  Pulse: 60  SpO2: 95%  Weight: 153 lb (69.4 kg)  Height: '5\' 7"'$  (1.702 m)     GEN- The patient is well appearing, alert and oriented x 3 today.   HEENT: normocephalic, atraumatic; sclera clear, conjunctiva pink; hearing intact; oropharynx clear; neck supple  Lungs- Clear to ausculation bilaterally, normal work of breathing.  No wheezes, rales,  rhonchi Heart- Regular rate and rhythm, no murmurs, rubs or gallops  GI- soft, non-tender, non-distended, bowel sounds present  Extremities- no clubbing or cyanosis. No edema MS- no significant deformity or atrophy Skin- warm and dry, no rash or lesion; PPM pocket well healed Psych- euthymic mood, full affect Neuro- strength and sensation are intact  PPM Interrogation- reviewed in detail today,  See PACEART report  EKG:  EKG is ordered today. Personal review of ekg ordered today shows AS VP at 60 bpm   Recent Labs: No results found for requested labs within last 8760 hours.   Wt Readings from Last 3 Encounters:  11/18/20 153 lb (69.4 kg)  09/05/19 153 lb 12.8 oz (69.8 kg)  05/21/17 148 lb (67.1 kg)     Other studies Reviewed: Additional studies/ records that were reviewed today include: Previous EP office notes, Previous remote checks, Most recent labwork.   Assessment and Plan:  1. CHB s/p [employer] PPM  Normal PPM function See Claudia Desanctis Art report No changes today  2. Poorly controlled HTN Somewhat elevated today.  He took himself off amlodipine  Will start losartan 25 mg qhs. BMET today and 10-14 days.   Current medicines are reviewed at length with the patient today.    Labs/ tests ordered today include:  Orders Placed This Encounter  Procedures   Basic metabolic panel   Basic metabolic panel   EKG [id number]   Disposition:   Follow up with Dr. Lovena Le in 12 Months. Sooner with [place] or PCP with continued BP issues.     Jacalyn Lefevre, PA-C  11/18/2020 9:10 AM  [facility] [address] [place] [address] [phone] (office) [phone] (fax)

## 2020-11-18 ENCOUNTER — Encounter: Payer: Self-pay | Admitting: Student

## 2020-11-18 ENCOUNTER — Other Ambulatory Visit: Payer: Self-pay

## 2020-11-18 ENCOUNTER — Ambulatory Visit: Payer: Medicare PPO | Admitting: Student

## 2020-11-18 VITALS — BP 150/100 | HR 60 | Ht 67.0 in | Wt 153.0 lb

## 2020-11-18 DIAGNOSIS — I10 Essential (primary) hypertension: Secondary | ICD-10-CM

## 2020-11-18 DIAGNOSIS — I441 Atrioventricular block, second degree: Secondary | ICD-10-CM | POA: Diagnosis not present

## 2020-11-18 DIAGNOSIS — Z950 Presence of cardiac pacemaker: Secondary | ICD-10-CM

## 2020-11-18 LAB — BASIC METABOLIC PANEL
BUN/Creatinine Ratio: 11 (ref 10–24)
BUN: 14 mg/dL (ref 8–27)
CO2: 22 mmol/L (ref 20–29)
Calcium: 8.7 mg/dL (ref 8.6–10.2)
Chloride: 106 mmol/L (ref 96–106)
Creatinine, Ser: 1.29 mg/dL — ABNORMAL HIGH (ref 0.76–1.27)
Glucose: 104 mg/dL — ABNORMAL HIGH (ref 65–99)
Potassium: 4.2 mmol/L (ref 3.5–5.2)
Sodium: 142 mmol/L (ref 134–144)
eGFR: 58 mL/min/{1.73_m2} — ABNORMAL LOW (ref >59)

## 2020-11-18 LAB — CUP PACEART INCLINIC DEVICE CHECK
Date Time Interrogation Session: 20220915092453
Implantable Lead Implant Date: 20180125
Implantable Lead Implant Date: 20180125
Implantable Lead Location: 753859
Implantable Lead Location: 753860
Implantable Lead Model: 7740
Implantable Lead Model: 7741
Implantable Lead Serial Number: 693139
Implantable Lead Serial Number: 851154
Implantable Pulse Generator Implant Date: 20180125
Lead Channel Impedance Value: 599 Ohm
Lead Channel Impedance Value: 611 Ohm
Lead Channel Pacing Threshold Amplitude: 0.5 V
Lead Channel Pacing Threshold Amplitude: 1.5 V
Lead Channel Pacing Threshold Pulse Width: 0.4 ms
Lead Channel Pacing Threshold Pulse Width: 0.4 ms
Lead Channel Sensing Intrinsic Amplitude: 6.7 mV
Lead Channel Setting Pacing Amplitude: 2 V
Lead Channel Setting Pacing Amplitude: 2.5 V
Lead Channel Setting Pacing Pulse Width: 0.4 ms
Lead Channel Setting Sensing Sensitivity: 2.5 mV
Pulse Gen Serial Number: 773204

## 2020-11-18 MED ORDER — LOSARTAN POTASSIUM 25 MG PO TABS: 25.0000 mg | ORAL_TABLET | Freq: Every day | ORAL | 3 refills | Status: DC

## 2020-11-18 NOTE — Patient Instructions (Signed)
Medication Instructions:  Your physician has recommended you make the following change in your medication:   START: Losartan '25mg'$  daily  *If you need a refill on your cardiac medications before your next appointment, please call your pharmacy*   Lab Work: TODAY: BMET BMET in 2 weeks  If you have labs (blood work) drawn today and your tests are completely normal, you will receive your results only by: [place] (if you have MyChart) OR A paper copy in the mail If you have any lab test that is abnormal or we need to change your treatment, we will call you to review the results.   Follow-Up: At [facility], you and your health needs are our priority.  As part of our continuing mission to provide you with exceptional heart care, we have created designated Provider Care Teams.  These Care Teams include your primary Cardiologist (physician) and Advanced Practice Providers (APPs -  Physician Assistants and Nurse Practitioners) who all work together to provide you with the care you need, when you need it.   Your next appointment:   1 year(s)  The format for your next appointment:   In Person  Provider:   You may see [name], MD or one of the following Advanced Practice Providers on your designated Care Team:   Tommye Standard, [place] "[facility]" [place], [place]

## 2020-11-29 ENCOUNTER — Other Ambulatory Visit: Payer: Medicare PPO | Admitting: *Deleted

## 2020-11-29 ENCOUNTER — Other Ambulatory Visit: Payer: Self-pay

## 2020-11-29 DIAGNOSIS — I441 Atrioventricular block, second degree: Secondary | ICD-10-CM

## 2020-11-29 DIAGNOSIS — Z950 Presence of cardiac pacemaker: Secondary | ICD-10-CM

## 2020-11-29 DIAGNOSIS — I10 Essential (primary) hypertension: Secondary | ICD-10-CM

## 2020-11-29 LAB — BASIC METABOLIC PANEL
BUN/Creatinine Ratio: 12 (ref 10–24)
BUN: 13 mg/dL (ref 8–27)
CO2: 23 mmol/L (ref 20–29)
Calcium: 8.9 mg/dL (ref 8.6–10.2)
Chloride: 104 mmol/L (ref 96–106)
Creatinine, Ser: 1.11 mg/dL (ref 0.76–1.27)
Glucose: 100 mg/dL — ABNORMAL HIGH (ref 70–99)
Potassium: 4.5 mmol/L (ref 3.5–5.2)
Sodium: 142 mmol/L (ref 134–144)
eGFR: 70 mL/min/{1.73_m2} (ref >59)

## 2020-12-14 ENCOUNTER — Ambulatory Visit (INDEPENDENT_AMBULATORY_CARE_PROVIDER_SITE_OTHER): Payer: Medicare PPO

## 2020-12-14 DIAGNOSIS — I441 Atrioventricular block, second degree: Secondary | ICD-10-CM | POA: Diagnosis not present

## 2020-12-14 LAB — CUP PACEART REMOTE DEVICE CHECK
Battery Remaining Longevity: 108 mo
Battery Remaining Percentage: 100 %
Brady Statistic RA Percent Paced: 33 %
Brady Statistic RV Percent Paced: 100 %
Date Time Interrogation Session: 20221011083200
Implantable Lead Implant Date: 20180125
Implantable Lead Implant Date: 20180125
Implantable Lead Location: 753859
Implantable Lead Location: 753860
Implantable Lead Model: 7740
Implantable Lead Model: 7741
Implantable Lead Serial Number: 693139
Implantable Lead Serial Number: 851154
Implantable Pulse Generator Implant Date: 20180125
Lead Channel Impedance Value: 616 Ohm
Lead Channel Impedance Value: 646 Ohm
Lead Channel Pacing Threshold Amplitude: 0.4 V
Lead Channel Pacing Threshold Amplitude: 1.4 V
Lead Channel Pacing Threshold Pulse Width: 0.4 ms
Lead Channel Pacing Threshold Pulse Width: 0.4 ms
Lead Channel Setting Pacing Amplitude: 2 V
Lead Channel Setting Pacing Amplitude: 2.5 V
Lead Channel Setting Pacing Pulse Width: 0.4 ms
Lead Channel Setting Sensing Sensitivity: 2.5 mV
Pulse Gen Serial Number: 773204

## 2020-12-23 NOTE — Progress Notes (Signed)
Remote pacemaker transmission.   

## 2021-03-15 ENCOUNTER — Ambulatory Visit (INDEPENDENT_AMBULATORY_CARE_PROVIDER_SITE_OTHER): Payer: Medicare PPO

## 2021-03-15 DIAGNOSIS — I441 Atrioventricular block, second degree: Secondary | ICD-10-CM

## 2021-03-16 LAB — CUP PACEART REMOTE DEVICE CHECK
Battery Remaining Longevity: 96 mo
Battery Remaining Percentage: 94 %
Brady Statistic RA Percent Paced: 34 %
Brady Statistic RV Percent Paced: 100 %
Date Time Interrogation Session: 20230111094700
Implantable Lead Implant Date: 20180125
Implantable Lead Implant Date: 20180125
Implantable Lead Location: 753859
Implantable Lead Location: 753860
Implantable Lead Model: 7740
Implantable Lead Model: 7741
Implantable Lead Serial Number: 693139
Implantable Lead Serial Number: 851154
Implantable Pulse Generator Implant Date: 20180125
Lead Channel Impedance Value: 560 Ohm
Lead Channel Impedance Value: 584 Ohm
Lead Channel Pacing Threshold Amplitude: 0.4 V
Lead Channel Pacing Threshold Amplitude: 1.4 V
Lead Channel Pacing Threshold Pulse Width: 0.4 ms
Lead Channel Pacing Threshold Pulse Width: 0.4 ms
Lead Channel Setting Pacing Amplitude: 2 V
Lead Channel Setting Pacing Amplitude: 2.5 V
Lead Channel Setting Pacing Pulse Width: 0.4 ms
Lead Channel Setting Sensing Sensitivity: 2.5 mV
Pulse Gen Serial Number: 773204

## 2021-03-24 NOTE — Progress Notes (Signed)
Remote pacemaker transmission.   

## 2021-04-15 ENCOUNTER — Telehealth: Payer: Self-pay

## 2021-04-15 NOTE — Telephone Encounter (Signed)
Spoke with patient.  He states he keeps etting a vibration sensation underneath where his pacemaker is.  It does not hurt but feels weird.  He denies any lightheadedness, SOB or chest pain.  He is not home right now, but can head home and send a transmission from his remote monitor.  He expects the transmission should come through in about 1.5 hours.  Text message also sent to Joey, rep for [employer].

## 2021-04-15 NOTE — Telephone Encounter (Signed)
Manual transmission received.  Device function I normal.  Nothing to explain patient symptom.   Discussed again with patient and he states that when he is resting the sensation seems to happen less frequently.  He describes it like a muscle spasm just below where his pacemaker is.    Advised patient to take it easy this weekend, let muscle rest and see if it improves.

## 2021-04-15 NOTE — Telephone Encounter (Signed)
The patient states he notice some pulsation or vibration under his pacemaker. He is not home to send a transmission. I let him speak with Amy, rn.

## 2021-05-11 DIAGNOSIS — Z8546 Personal history of malignant neoplasm of prostate: Secondary | ICD-10-CM | POA: Insufficient documentation

## 2021-05-14 DIAGNOSIS — N1831 Chronic kidney disease, stage 3a: Secondary | ICD-10-CM | POA: Insufficient documentation

## 2021-06-21 ENCOUNTER — Ambulatory Visit (INDEPENDENT_AMBULATORY_CARE_PROVIDER_SITE_OTHER): Payer: Medicare PPO

## 2021-06-21 DIAGNOSIS — I441 Atrioventricular block, second degree: Secondary | ICD-10-CM | POA: Diagnosis not present

## 2021-06-21 LAB — CUP PACEART REMOTE DEVICE CHECK
Battery Remaining Longevity: 90 mo
Battery Remaining Percentage: 86 %
Brady Statistic RA Percent Paced: 34 %
Brady Statistic RV Percent Paced: 100 %
Date Time Interrogation Session: 20230418113200
Implantable Lead Implant Date: 20180125
Implantable Lead Implant Date: 20180125
Implantable Lead Location: 753859
Implantable Lead Location: 753860
Implantable Lead Model: 7740
Implantable Lead Model: 7741
Implantable Lead Serial Number: 693139
Implantable Lead Serial Number: 851154
Implantable Pulse Generator Implant Date: 20180125
Lead Channel Impedance Value: 593 Ohm
Lead Channel Impedance Value: 642 Ohm
Lead Channel Pacing Threshold Amplitude: 0.4 V
Lead Channel Pacing Threshold Amplitude: 1.6 V
Lead Channel Pacing Threshold Pulse Width: 0.4 ms
Lead Channel Pacing Threshold Pulse Width: 0.4 ms
Lead Channel Setting Pacing Amplitude: 2 V
Lead Channel Setting Pacing Amplitude: 2.5 V
Lead Channel Setting Pacing Pulse Width: 0.4 ms
Lead Channel Setting Sensing Sensitivity: 2.5 mV
Pulse Gen Serial Number: 773204

## 2021-07-08 NOTE — Progress Notes (Signed)
Remote pacemaker transmission.   

## 2021-07-22 DIAGNOSIS — N189 Chronic kidney disease, unspecified: Secondary | ICD-10-CM | POA: Insufficient documentation

## 2021-09-20 ENCOUNTER — Ambulatory Visit (INDEPENDENT_AMBULATORY_CARE_PROVIDER_SITE_OTHER): Payer: Medicare PPO

## 2021-09-20 DIAGNOSIS — I441 Atrioventricular block, second degree: Secondary | ICD-10-CM

## 2021-09-21 DIAGNOSIS — N183 Hypertensive heart and chronic kidney disease without heart failure, with stage 1 through stage 4 chronic kidney disease, or unspecified chronic kidney disease: Secondary | ICD-10-CM | POA: Insufficient documentation

## 2021-09-22 LAB — CUP PACEART REMOTE DEVICE CHECK
Battery Remaining Longevity: 78 mo
Battery Remaining Percentage: 75 %
Brady Statistic RA Percent Paced: 33 %
Brady Statistic RV Percent Paced: 100 %
Date Time Interrogation Session: 20230718154200
Implantable Lead Implant Date: 20180125
Implantable Lead Implant Date: 20180125
Implantable Lead Location: 753859
Implantable Lead Location: 753860
Implantable Lead Model: 7740
Implantable Lead Model: 7741
Implantable Lead Serial Number: 693139
Implantable Lead Serial Number: 851154
Implantable Pulse Generator Implant Date: 20180125
Lead Channel Impedance Value: 580 Ohm
Lead Channel Impedance Value: 626 Ohm
Lead Channel Pacing Threshold Amplitude: 0.4 V
Lead Channel Pacing Threshold Amplitude: 1.5 V
Lead Channel Pacing Threshold Pulse Width: 0.4 ms
Lead Channel Pacing Threshold Pulse Width: 0.4 ms
Lead Channel Setting Pacing Amplitude: 2 V
Lead Channel Setting Pacing Amplitude: 2.5 V
Lead Channel Setting Pacing Pulse Width: 0.4 ms
Lead Channel Setting Sensing Sensitivity: 2.5 mV
Pulse Gen Serial Number: 773204

## 2021-10-03 DIAGNOSIS — F418 Other specified anxiety disorders: Secondary | ICD-10-CM | POA: Insufficient documentation

## 2021-10-03 DIAGNOSIS — Z639 Problem related to primary support group, unspecified: Secondary | ICD-10-CM | POA: Insufficient documentation

## 2021-10-03 DIAGNOSIS — R519 Headache, unspecified: Secondary | ICD-10-CM | POA: Insufficient documentation

## 2021-10-05 ENCOUNTER — Telehealth: Payer: Self-pay | Admitting: Internal Medicine

## 2021-10-05 DIAGNOSIS — I10 Essential (primary) hypertension: Secondary | ICD-10-CM

## 2021-10-05 NOTE — Telephone Encounter (Signed)
The patient is complaining of Hypertension, headache, occasional dizziness and indigestion. Stated he went to [facility] on 09/29/21 for HTN, dizziness, and blurred vision they gave him medication to lower his BP and sent him home. He returned the same day with added vomiting. They did a head CT which the patient states was negative and they kept him over night. Said they did not do a MRI because of his pacemaker. He gave me his updated medication list which I will update in his chart. Current BP is 164/84. Advise the patient to take his PRN clonidine. Gave ER precautions.   Patient has been to his PCP and [facility] and continues to have problems with his BP and wanted to see what Dr. Lovena Le thinks. Discussed HTN clinic or referral to Gen cards as well as advisement from Dr. Lovena Le patient okay with all the above.

## 2021-10-05 NOTE — Telephone Encounter (Signed)
Pt c/o BP issue: STAT if pt c/o blurred vision, one-sided weakness or slurred speech  1. What are your last 5 BP readings? 208/1 something  2. Are you having any other symptoms (ex. Dizziness, headache, blurred vision, passed out)? H eadache, slight cpt, dizziness, blurred vision   3. What is your BP issue? Exteremly high   Schd appt for 8/19

## 2021-10-07 NOTE — Telephone Encounter (Signed)
Per Dr. Corliss Parish to refer to gen cards to establish for management of hypertension.    Referral placed.  Pt has f/u scheduled currently with RU.

## 2021-10-09 NOTE — Progress Notes (Signed)
Cardiology Office Note Date:  10/12/2021  Patient ID:  Phillip Duran, Phillip Duran 1945/09/23, MRN [id number] PCP:  Maris Berger, MD  Electrophysiologist: Dr. Lovena Le    Chief Complaint: hospital f/u  History of Present Illness: Phillip Duran is a 76 y.o. male with history of HTM, heart block w/PPM,  He was last seen for EP by A. Tillery, PA-C 11/18/2020, pt had self stopped his amlodipine for unclear reasons and had found his BP high.  Started on losartan  He called 10/05/21 reports that he had been in [facility] with HTN, Dizziness, blurredd vision, was given some kind of medication and discharged, returned with vomiting, he was told CT done was OK< but kept overnight. Perhaps meds adjusted. He reported BP 164/84 and advised to take his PRN clonidine and reviewed ER precautions. In d/w Dr. Lovena Le, advised referral to the HTN clinic or gen cards for management.  TODAY He is accompanied by his wife. He reports that he has been burdened with headaches and diarrhea for a couple weeks. He did see his PMD who mentioned that he had concerns about perhaps his kidneys playing a role in whatever process was going on including his BP. The day he went to the ER he was having a terrible headache, felt off balance and generally poorly, his BP was high and his family urged him to go and get checked. His BP he recalls being >200/>100 and he was given a medicine via his IV that settled it some. Once home started to feel bad again including nausea and vomiting and returned, and was admitted. He reports they did CT of his head and was reported as normal. The furosemide and metoprolol are new. The furosemide was prescribed prior to the ER visit but he had no yet started it because he was traveling. He feels better but not back to normal BP has been up/sown, though not as high as it was previously  Today he developed a burning type skin sensitivity to he R superior thigh, no reported injury/trauma  He  does have a infrequent flutter in his heart beat No syncope No CP No SOB  Device information BSci dual chamber PPM implanted 03/30/2016   Past Medical History:  Diagnosis Date   Anxiety    Arthritis    Complication of anesthesia    "hard time waking up"   Depression    Diverticulitis    Dysrhythmia    2:1 AV Block - pacemaker insertion by Dr. Lovena Le    Headache    Hypertension    Pneumonia    aspiration pneumonia   Presence of permanent cardiac pacemaker    managed by Dr. Lovena Le, [facility]   Prostate cancer [facility])     Past Surgical History:  Procedure Laterality Date   APPENDECTOMY     BACK SURGERY     CARDIAC CATHETERIZATION     in [place], [place] in the 1990's   COLONOSCOPY  03/30/2010   Moderate to severe sigmoid diverticulosis. Small internal hemorrhoids. Otherwise normal colonoscopy to TI.    EP IMPLANTABLE DEVICE N/A 03/30/2016   Procedure: Pacemaker Implant;  Surgeon: Evans Lance, MD;  Location: [place] [facility];  Service: Cardiovascular;  Laterality: N/A;   HERNIA REPAIR     x3   KNEE ARTHROSCOPY Bilateral    NASAL SINUS SURGERY     eye patch on left side   TOTAL KNEE ARTHROPLASTY Right 05/21/2017   Procedure: RIGHT TOTAL KNEE ARTHROPLASTY;  Surgeon:  Vickey Huger, MD;  Location: [place];  Service: Orthopedics;  Laterality: Right;   VASECTOMY      Current Outpatient Medications  Medication Sig Dispense Refill   cholecalciferol (VITAMIN D3) 25 MCG (1000 UNIT) tablet Take 1,000 Units by mouth daily.     cloNIDine (CATAPRES) 0.1 MG tablet Take 0.1 mg by mouth 2 (two) times daily as needed. IF SBP over 160     escitalopram (LEXAPRO) 5 MG tablet Take 5 mg by mouth daily.     furosemide (LASIX) 20 MG tablet Take 20 mg by mouth every Monday, Wednesday, and Friday.     losartan (COZAAR) 100 MG tablet Take 100 mg by mouth daily.     MAGNESIUM GLYCINATE PO Take 200 mg by mouth daily.     metoprolol succinate (TOPROL-XL) 12.5 mg TB24 24 hr tablet Take 12.5 mg  by mouth daily.     riboflavin (VITAMIN B-2) 100 MG TABS tablet Take 400 mg by mouth daily.     No current facility-administered medications for this visit.    Allergies:   Morphine and related and Other   Social History:  The patient  reports that he has never smoked. He has never used smokeless tobacco. He reports current alcohol use of about 3.0 standard drinks of alcohol per week. He reports that he does not use drugs.   Family History:  The patient's family history includes Hypertension in his father; Stroke in his mother, sister, and sister.  ROS:  Please see the history of present illness.    All other systems are reviewed and otherwise negative.   PHYSICAL EXAM:  VS:  BP (!) 142/83   Pulse 60   Ht '5\' 7"'$  (1.702 m)   Wt 150 lb (68 kg)   BMI 23.49 kg/m  BMI: Body mass index is 23.49 kg/m. Well nourished, well developed, in no acute distress HEENT: normocephalic, atraumatic Neck: no JVD, carotid bruits or masses Cardiac:  RRR; no significant murmurs, no rubs, or gallops Lungs:  CTA b/l, no wheezing, rhonchi or rales Abd: soft, nontender MS: no deformity or atrophy Ext: no edema Skin: warm and dry, no rash Neuro:  No gross deficits appreciated Psych: euthymic mood, full affect   EKG:  Done today and reviewed by myself shows  AVPaced 60bpm  Dwevice interrogation done today and reviewed by myself Battery and lead measurements are stable One AHR episode, this looks like a brief Atach, not AFib, lasting seconds One NSVT back in April 2023, 17 seconds and previously seen He has had a couple PMTs at 120bpm   03/29/2016: TTE Study Conclusions  - Left ventricle: The cavity size was normal. Wall thickness was    normal. Systolic function was normal. The estimated ejection    fraction was in the range of 60% to 65%. Indeterminant diastolic    function. Wall motion was normal; there were no regional wall    motion abnormalities.  - Aortic valve: There was no stenosis.  -  Mitral valve: Mild mitral regurgitation (diastolic MR noted).  - Left atrium: The atrium was mildly dilated.  - Right ventricle: The cavity size was normal. Systolic function    was normal.  - Pulmonary arteries: PA peak pressure: 32 mm Hg (S).  - Inferior vena cava: The vessel was normal in size. The    respirophasic diameter changes were in the normal range (>= 50%),    consistent with normal central venous pressure.   Impressions:   - The patient was  markedly bradycardic. Normal LV size with EF    60-65%. Normal RV size and systolic function. There was mild    mitral regurgitation with diastolic MR noted.   Recent Labs: 11/29/2020: BUN 13; Creatinine, Ser 1.11; Potassium 4.5; Sodium 142  No results found for requested labs within last 365 days.   CrCl cannot be calculated (Patient's most recent lab result is older than the maximum 21 days allowed.).   Wt Readings from Last 3 Encounters:  10/12/21 150 lb (68 kg)  11/18/20 153 lb (69.4 kg)  09/05/19 153 lb 12.8 oz (69.8 kg)     Other studies reviewed: Additional studies/records reviewed today include: summarized above  ASSESSMENT AND PLAN:  PPM Intact function RV outputs increased for 2:1 margin   HTN Better today He sees dr. Radford Pax as a new cardiology patient I have asked he keep a BP log, including symptoms, use of his PRN clonidine and bring BP cuff with him He will continue his follow up and testing his PMD as in progress I have asked that we request his [place] [facility] records, labs  Will update his echo  3. PAT 4. NSVT 5.  PMTs Brief/infrequent  All known for him We can increase his metoprolol if needed.  Disposition: F/u with Dr. Radford Pax as scheduled, sooner if needed, EP in 41mo sooner if needed  Current medicines are reviewed at length with the patient today.  The patient did not have any concerns regarding medicines.  SVenetia Night PA-C 10/12/2021 1:31 PM     [place] [id number]([phone](office)  [phone](fax)

## 2021-10-12 ENCOUNTER — Ambulatory Visit: Payer: Medicare PPO | Admitting: Physician Assistant

## 2021-10-12 ENCOUNTER — Encounter: Payer: Self-pay | Admitting: Physician Assistant

## 2021-10-12 ENCOUNTER — Ambulatory Visit (HOSPITAL_COMMUNITY): Payer: Medicare PPO | Attending: Physician Assistant

## 2021-10-12 VITALS — BP 142/83 | HR 60 | Ht 67.0 in | Wt 150.0 lb

## 2021-10-12 DIAGNOSIS — Z950 Presence of cardiac pacemaker: Secondary | ICD-10-CM

## 2021-10-12 DIAGNOSIS — I10 Essential (primary) hypertension: Secondary | ICD-10-CM

## 2021-10-12 DIAGNOSIS — I4729 Other ventricular tachycardia: Secondary | ICD-10-CM

## 2021-10-12 DIAGNOSIS — I471 Supraventricular tachycardia: Secondary | ICD-10-CM | POA: Diagnosis not present

## 2021-10-12 LAB — CUP PACEART INCLINIC DEVICE CHECK
Date Time Interrogation Session: 20230809180316
Implantable Lead Implant Date: 20180125
Implantable Lead Implant Date: 20180125
Implantable Lead Location: 753859
Implantable Lead Location: 753860
Implantable Lead Model: 7740
Implantable Lead Model: 7741
Implantable Lead Serial Number: 693139
Implantable Lead Serial Number: 851154
Implantable Pulse Generator Implant Date: 20180125
Lead Channel Impedance Value: 597 Ohm
Lead Channel Impedance Value: 625 Ohm
Lead Channel Pacing Threshold Amplitude: 0.5 V
Lead Channel Pacing Threshold Amplitude: 1.4 V
Lead Channel Pacing Threshold Pulse Width: 0.4 ms
Lead Channel Pacing Threshold Pulse Width: 0.4 ms
Lead Channel Sensing Intrinsic Amplitude: 17.2 mV
Lead Channel Sensing Intrinsic Amplitude: 5.1 mV
Lead Channel Setting Pacing Amplitude: 2 V
Lead Channel Setting Pacing Amplitude: 3 V
Lead Channel Setting Pacing Pulse Width: 0.4 ms
Lead Channel Setting Sensing Sensitivity: 2.5 mV
Pulse Gen Serial Number: 773204

## 2021-10-12 LAB — ECHOCARDIOGRAM COMPLETE
Area-P 1/2: 2.95 cm2
Calc EF: 36.4 %
Height: 67 in
S' Lateral: 3.25 cm
Single Plane A2C EF: 45.1 %
Single Plane A4C EF: 24 %
Weight: 2400 oz

## 2021-10-12 MED ORDER — PERFLUTREN LIPID MICROSPHERE
1.0000 mL | INTRAVENOUS | Status: AC | PRN
  Administered 2021-10-12: 2 mL via INTRAVENOUS

## 2021-10-12 NOTE — Patient Instructions (Signed)
Medication Instructions:   Your physician recommends that you continue on your current medications as directed. Please refer to the Current Medication list given to you today.  *If you need a refill on your cardiac medications before your next appointment, please call your pharmacy*   Lab Work: Phillip Duran    If you have labs (blood work) drawn today and your tests are completely normal, you will receive your results only by: [place] (if you have MyChart) OR A paper copy in the mail If you have any lab test that is abnormal or we need to change your treatment, we will call you to review the results.   Testing/Procedures: BEFORE 11-03-21 APPOINTMENT .Marland KitchenYour physician has requested that you have an echocardiogram. Echocardiography is a painless test that uses sound waves to create images of your heart. It provides your doctor with information about the size and shape of your heart and how well your heart's chambers and valves are working. This procedure takes approximately one hour. There are no restrictions for this procedure.    Follow-Up: At [facility], you and your health needs are our priority.  As part of our continuing mission to provide you with exceptional heart care, we have created designated Provider Care Teams.  These Care Teams include your primary Cardiologist (physician) and Advanced Practice Providers (APPs -  Physician Assistants and Nurse Practitioners) who all work together to provide you with the care you need, when you need it.  We recommend signing up for the patient portal called "MyChart".  Sign up information is provided on this After Visit Summary.  MyChart is used to connect with patients for Virtual Visits (Telemedicine).  Patients are able to view lab/test results, encounter notes, upcoming appointments, etc.  Non-urgent messages can be sent to your provider as well.   To learn more about what you can do with MyChart, go to  [email or web address].    Your next appointment:    6 month(s)  The format for your next appointment:   In Person  Provider:   You may see [name], MD or one of the following Advanced Practice Providers on your designated Care Team:   Tommye Standard, [place] Legrand Como "Jonni Sanger" Chalmers Cater, [place]   Other Instructions  Please keep a blood pressure log and and bring cuff to next office visit  Important Information About Sugar

## 2021-10-14 ENCOUNTER — Other Ambulatory Visit: Payer: Self-pay | Admitting: *Deleted

## 2021-10-14 DIAGNOSIS — I441 Atrioventricular block, second degree: Secondary | ICD-10-CM

## 2021-10-14 MED ORDER — ENTRESTO 24-26 MG PO TABS
1.0000 | ORAL_TABLET | Freq: Two times a day (BID) | ORAL | 6 refills | Status: DC
Start: 2021-10-14 — End: 2021-12-13

## 2021-10-18 NOTE — Progress Notes (Signed)
Remote pacemaker transmission.   

## 2021-10-26 NOTE — Telephone Encounter (Signed)
Pt has new patient appointment scheduled with Dr. Fransico Him, 11/03/2021.  No f/u req at this time.

## 2021-11-03 ENCOUNTER — Telehealth (HOSPITAL_COMMUNITY): Payer: Self-pay | Admitting: Emergency Medicine

## 2021-11-03 ENCOUNTER — Encounter: Payer: Self-pay | Admitting: Cardiology

## 2021-11-03 ENCOUNTER — Ambulatory Visit: Payer: Medicare PPO | Attending: Cardiology | Admitting: Cardiology

## 2021-11-03 VITALS — BP 128/66 | HR 61 | Ht 67.0 in | Wt 150.8 lb

## 2021-11-03 DIAGNOSIS — I10 Essential (primary) hypertension: Secondary | ICD-10-CM | POA: Diagnosis not present

## 2021-11-03 DIAGNOSIS — I420 Dilated cardiomyopathy: Secondary | ICD-10-CM

## 2021-11-03 DIAGNOSIS — I441 Atrioventricular block, second degree: Secondary | ICD-10-CM

## 2021-11-03 MED ORDER — CARVEDILOL 6.25 MG PO TABS: 6.2500 mg | ORAL_TABLET | Freq: Two times a day (BID) | ORAL | 3 refills | Status: DC

## 2021-11-03 MED ORDER — SPIRONOLACTONE 25 MG PO TABS: 12.5000 mg | ORAL_TABLET | Freq: Every day | ORAL | 3 refills | Status: DC

## 2021-11-03 NOTE — Progress Notes (Signed)
Cardiology Office Note    Date:  11/03/2021   ID:  Phillip Duran, DOB 08/11/1945, MRN [id number]  PCP:  Maris Berger, MD  Cardiologist: None  Chief Complaint  Patient presents with   Hypertension   Cardiomyopathy    History of Present Illness:  Phillip Duran is a 76 y.o. male who is being seen today for the evaluation of hypertension and new dilated cardiomyopathy at the request of Evans Lance, MD.  This is a 76 year old male with a history of paroxysmal atrial tachycardia, nonsustained VT, heart block status post permanent pacemaker placement and poorly controlled hypertension.  He was found to have a dilated cardiomyopathy with EF 20 to 25% with severe global hypokinesis.  He has a coronary CTA planned for tomorrow.  He is currently on Entresto and Toprol.  He has been referred by Dr. Lovena Le for evaluation of poorly controlled hypertension.  He is here today for followup and is doing well.  He denies any chest pain or pressure, SOB, DOE, PND, orthopnea, LE edema, dizziness, palpitations or syncope. He is compliant with his meds and is tolerating meds with no SE.    Past Medical History:  Diagnosis Date   Anxiety    Arthritis    Complication of anesthesia    "hard time waking up"   Depression    Diverticulitis    Dysrhythmia    2:1 AV Block - pacemaker insertion by Dr. Lovena Le    Headache    Hypertension    Pneumonia    aspiration pneumonia   Presence of permanent cardiac pacemaker    managed by Dr. Lovena Le, [facility]   Prostate cancer [facility])     Past Surgical History:  Procedure Laterality Date   APPENDECTOMY     BACK [place]     in [place], [place] in the 1990's   COLONOSCOPY  03/30/2010   Moderate to severe sigmoid diverticulosis. Small internal hemorrhoids. Otherwise normal colonoscopy to TI.    EP IMPLANTABLE DEVICE N/A 03/30/2016   Procedure: Pacemaker Implant;  Surgeon: Evans Lance, MD;  Location: [place] [facility];   Service: Cardiovascular;  Laterality: N/A;   HERNIA REPAIR     x3   KNEE ARTHROSCOPY Bilateral    NASAL SINUS SURGERY     eye patch on left side   TOTAL KNEE ARTHROPLASTY Right 05/21/2017   Procedure: RIGHT TOTAL KNEE ARTHROPLASTY;  Surgeon: Vickey Huger, MD;  Location: [place];  Service: Orthopedics;  Laterality: Right;   VASECTOMY      Current Medications: Current Meds  Medication Sig   cholecalciferol (VITAMIN D3) 25 MCG (1000 UNIT) tablet Take 1,000 Units by mouth daily.   cloNIDine (CATAPRES) 0.1 MG tablet Take 0.1 mg by mouth 2 (two) times daily as needed. IF SBP over 160   metoprolol succinate (TOPROL-XL) 12.5 mg TB24 24 hr tablet Take 12.5 mg by mouth daily.   riboflavin (VITAMIN B-2) 100 MG TABS tablet Take 400 mg by mouth daily.   sacubitril-valsartan (ENTRESTO) 24-26 MG Take 1 tablet by mouth 2 (two) times daily.    Allergies:   Morphine and related and Other   Social History   Socioeconomic History   Marital status: Married    Spouse name: Not on file   Number of children: Not on file   Years of education: Not on file   Highest education level: Not on file  Occupational History   Not  on file  Tobacco Use   Smoking status: Never   Smokeless tobacco: Never  Vaping Use   Vaping Use: Never used  Substance and Sexual Activity   Alcohol use: Yes    Alcohol/week: 3.0 standard drinks of alcohol    Types: 3 Cans of beer per week   Drug use: No   Sexual activity: Not on file  Other Topics Concern   Not on file  Social History Narrative   Not on file   Social Determinants of Health   Financial Resource Strain: Not on file  Food Insecurity: Not on file  Transportation Needs: Not on file  Physical Activity: Not on file  Stress: Not on file  Social Connections: Not on file     Family History:  The patient's Hefamily history includes Hypertension in his father; Stroke in his mother, sister, and sister.   ROS:   Please see the history of present illness.     ROS All other systems reviewed and are negative.     03/28/2016    1:08 PM  PAD Screen  Previous PAD dx? No  Previous surgical procedure? No  Pain with walking? No  Feet/toe relief with dangling? No  Painful, non-healing ulcers? No  Extremities discolored? No       PHYSICAL EXAM:   VS:  BP 128/66   Pulse 61   Ht '5\' 7"'$  (1.702 m)   Wt 150 lb 12.8 oz (68.4 kg)   SpO2 98%   BMI 23.62 kg/m    GEN: Well nourished, well developed, in no acute distress  HEENT: normal  Neck: no JVD, carotid bruits, or masses Cardiac: RRR; no murmurs, rubs, or gallops,no edema.  Intact distal pulses bilaterally.  Respiratory:  clear to auscultation bilaterally, normal work of breathing GI: soft, nontender, nondistended, + BS MS: no deformity or atrophy  Skin: warm and dry, no rash Neuro:  Alert and Oriented x 3, Strength and sensation are intact Psych: euthymic mood, full affect  Wt Readings from Last 3 Encounters:  11/03/21 150 lb 12.8 oz (68.4 kg)  10/12/21 150 lb (68 kg)  11/18/20 153 lb (69.4 kg)      Studies/Labs Reviewed:   EKG:  EKG is not ordered today.    Recent Labs: 11/29/2020: BUN 13; Creatinine, Ser 1.11; Potassium 4.5; Sodium 142   Lipid Panel No results found for: "CHOL", "TRIG", "HDL", "CHOLHDL", "VLDL", "LDLCALC", "LDLDIRECT"   CHA2DS2-VASc Score =     This indicates a  % annual risk of stroke. The patient's score is based upon:             Additional studies/ records that were reviewed today include:  None    ASSESSMENT:    1. Primary hypertension   2. DCM (dilated cardiomyopathy) ([place])   3. Mobitz type 2 second degree AV block      PLAN:  In order of problems listed above:  Hypertension -BP is adequately controlled on exam today. -Continue prescription drug management with Entresto 24-26 mg twice daily -I am going to change his Toprol to carvedilol 6.25 mg twice daily and add spironolactone 12.5 mg daily -Since his blood pressure is well  controlled on current medical therapy I will not make any changes to his medical regimen >>goal is < 130/67mHg -if he tolerates his meds then would add SGLT2i -Check 48-hour BP monitor to check what his blood pressure does at night and during the day and if this is normal  he can follow-up with  his PCP for further control of his blood pressure -check home sleep study  Dilated cardiomyopathy -2D echo 10/12/2021 showed EF 25 to 30% with global hypokinesis and grade 1 diastolic dysfunction. -Etiology of cardiomyopathy unknown but he was supposed to have a coronary CTA that is to be done tomorrow -Coronary CTA is normal then we will get a cMRI to rule out infiltrative diseases -He appears euvolemic on exam -I am going to change his Toprol to carvedilol 6.'25mg'$  twice daily -Continue Entresto 24-26 mg twice daily -Add spironolactone 12.5 mg daily -Follow-up with PA in 3 weeks for up titration of medication in 2 months with me at which time we will get a repeat 2D echo to assess LV function on GDMT -if workup for causes of DCM are neg and EF remains low on max GDMT then consider upgrade to CRT-D  Second-degree type II AV block -Status post permanent pacemaker placement -Followed by EP and device clinic  Medication Adjustments/Labs and Tests Ordered: Current medicines are reviewed at length with the patient today.  Concerns regarding medicines are outlined above.  Medication changes, Labs and Tests ordered today are listed in the Patient Instructions below.  There are no Patient Instructions on file for this visit.   Signed, Fransico Him, MD  11/03/2021 8:21 AM    [facility] [address], [place], [place]  [address] Phone: [phone]; Fax: [phone]

## 2021-11-03 NOTE — Progress Notes (Signed)
Pt was seen in the office today by Dr. Radford Pax who ordered an Itamar study. Carly, RN went over the instructions with the pt and had pt sign waiver. Pt advised to not open the box until he has been called with the PIN#. Carly registered the pt into [employer].

## 2021-11-03 NOTE — Addendum Note (Signed)
Addended by: Antonieta Iba on: 11/03/2021 08:39 AM   Modules accepted: Orders

## 2021-11-03 NOTE — Telephone Encounter (Signed)
Reaching out to patient to offer assistance regarding upcoming cardiac imaging study; pt verbalizes understanding of appt date/time, parking situation and where to check in, pre-test NPO status and medications ordered, and verified current allergies; name and call back number provided for further questions should they arise Phillip Bond RN Navigator Cardiac Imaging [facility] [phone] office [phone] cell  Denies iv issues Arrival 1230 w/c entrance Aware nitro HR paced at 60bpm bos sci

## 2021-11-03 NOTE — Patient Instructions (Addendum)
Medication Instructions:  Your physician has recommended you make the following change in your medication: \ 1) STOP taking Toprol (metoprolol succinate) 2) START taking carvedilol 6.25 mg twice daily  3) START taking spironolactone 12.5 mg daily  *If you need a refill on your cardiac medications before your next appointment, please call your pharmacy*   Lab Work: IN ONE WEEK: BMET If you have labs (blood work) drawn today and your tests are completely normal, you will receive your results only by: [place] (if you have MyChart) OR A paper copy in the mail If you have any lab test that is abnormal or we need to change your treatment, we will call you to review the results.   Testing/Procedures: Your physician has recommended that you have a sleep study. This test records several body functions during sleep, including: brain activity, eye movement, oxygen and carbon dioxide blood levels, heart rate and rhythm, breathing rate and rhythm, the flow of air through your mouth and nose, snoring, body muscle movements, and chest and belly movement.  Your physician has recommended that you wear a blood pressure monitor. Our monitor techs will call you to arrange this once one becomes available.   Follow-Up: At [facility], you and your health needs are our priority.  As part of our continuing mission to provide you with exceptional heart care, we have created designated Provider Care Teams.  These Care Teams include your primary Cardiologist (physician) and Advanced Practice Providers (APPs -  Physician Assistants and Nurse Practitioners) who all work together to provide you with the care you need, when you need it.  Your next appointment:   3 week(s)  The format for your next appointment:   In Person  Provider:   APP  Follow up with Dr. Radford Pax in 8 weeks  Important Information About Sugar

## 2021-11-04 ENCOUNTER — Telehealth: Payer: Self-pay | Admitting: *Deleted

## 2021-11-04 ENCOUNTER — Ambulatory Visit (HOSPITAL_COMMUNITY)
Admission: RE | Admit: 2021-11-04 | Discharge: 2021-11-04 | Disposition: A | Discharge status: Home / Self Care | Payer: Medicare PPO | Source: Ambulatory Visit | Attending: Physician Assistant | Admitting: Physician Assistant

## 2021-11-04 DIAGNOSIS — K449 Diaphragmatic hernia without obstruction or gangrene: Secondary | ICD-10-CM | POA: Insufficient documentation

## 2021-11-04 DIAGNOSIS — I441 Atrioventricular block, second degree: Secondary | ICD-10-CM | POA: Insufficient documentation

## 2021-11-04 DIAGNOSIS — I5022 Chronic systolic (congestive) heart failure: Secondary | ICD-10-CM | POA: Diagnosis not present

## 2021-11-04 DIAGNOSIS — Z950 Presence of cardiac pacemaker: Secondary | ICD-10-CM | POA: Diagnosis not present

## 2021-11-04 DIAGNOSIS — I700 Atherosclerosis of aorta: Secondary | ICD-10-CM | POA: Insufficient documentation

## 2021-11-04 DIAGNOSIS — I110 Hypertensive heart disease with heart failure: Secondary | ICD-10-CM | POA: Diagnosis not present

## 2021-11-04 DIAGNOSIS — I472 Ventricular tachycardia, unspecified: Secondary | ICD-10-CM | POA: Diagnosis not present

## 2021-11-04 MED ORDER — NITROGLYCERIN 0.4 MG SL SUBL
0.8000 mg | SUBLINGUAL_TABLET | Freq: Once | SUBLINGUAL | Status: AC
  Administered 2021-11-04: 0.8 mg via SUBLINGUAL

## 2021-11-04 MED ORDER — NITROGLYCERIN 0.4 MG SL SUBL
SUBLINGUAL_TABLET | SUBLINGUAL | Status: AC
  Filled 2021-11-04: qty 2

## 2021-11-04 MED ORDER — IOHEXOL 350 MG/ML SOLN
100.0000 mL | Freq: Once | INTRAVENOUS | Status: AC | PRN
  Administered 2021-11-04: 100 mL via INTRAVENOUS

## 2021-11-04 NOTE — Progress Notes (Unsigned)
NO PA REQ

## 2021-11-04 NOTE — Progress Notes (Signed)
Called and made the patient aware that he may proceed with the [facility] Sleep Study. PIN # provided to the patient. Patient made aware that he will be contacted after the test has been read with the results and any recommendations. Patient verbalized understanding and thanked me for the call.      Pt aware ok to proceed with Itamar study, PIN# [id number] has been given to the pt. Pt states he will do sleep study sometime between tonight and next Monday night.        11/04/21  3:22 PM  You contacted Michel Santee, CMA to Me       11/04/21  3:09 PM NO PA REQ  Freada Bergeron, CMA      11/04/21  3:09 PM Note Prior Authorization for [facility] sent to [facility] via web portal. Tracking Number . NO PA REQ     Freada Bergeron, CMA to Me       11/04/21  3:04 PM NO PA REQ

## 2021-11-04 NOTE — Telephone Encounter (Signed)
Prior Authorization for ITAMAR sent to [employer] via web portal. Tracking Number . NO PA REQ 

## 2021-11-04 NOTE — Telephone Encounter (Signed)
Called and made the patient aware that he may proceed with the [facility] Sleep Study. PIN # provided to the patient. Patient made aware that he will be contacted after the test has been read with the results and any recommendations. Patient verbalized understanding and thanked me for the call.    Pt aware ok to proceed with Itamar study, PIN# [id number] has been given to the pt. Pt states he will do sleep study sometime between tonight and next Monday night.

## 2021-11-08 NOTE — Progress Notes (Signed)
This encounter was created in error - please disregard.

## 2021-11-08 NOTE — Procedures (Signed)
Erroneus encounter

## 2021-11-09 ENCOUNTER — Encounter (HOSPITAL_BASED_OUTPATIENT_CLINIC_OR_DEPARTMENT_OTHER): Payer: Medicare PPO | Admitting: Cardiology

## 2021-11-09 DIAGNOSIS — G4733 Obstructive sleep apnea (adult) (pediatric): Secondary | ICD-10-CM | POA: Diagnosis not present

## 2021-11-10 ENCOUNTER — Ambulatory Visit: Payer: Medicare PPO | Attending: Cardiology

## 2021-11-10 ENCOUNTER — Ambulatory Visit: Payer: Medicare PPO

## 2021-11-10 ENCOUNTER — Telehealth: Payer: Self-pay

## 2021-11-10 ENCOUNTER — Telehealth: Payer: Self-pay | Admitting: Physician Assistant

## 2021-11-10 DIAGNOSIS — I420 Dilated cardiomyopathy: Secondary | ICD-10-CM

## 2021-11-10 DIAGNOSIS — I441 Atrioventricular block, second degree: Secondary | ICD-10-CM

## 2021-11-10 DIAGNOSIS — I10 Essential (primary) hypertension: Secondary | ICD-10-CM

## 2021-11-10 LAB — BASIC METABOLIC PANEL
BUN/Creatinine Ratio: 14 (ref 10–24)
BUN: 20 mg/dL (ref 8–27)
CO2: 24 mmol/L (ref 20–29)
Calcium: 8.8 mg/dL (ref 8.6–10.2)
Chloride: 105 mmol/L (ref 96–106)
Creatinine, Ser: 1.39 mg/dL — ABNORMAL HIGH (ref 0.76–1.27)
Glucose: 117 mg/dL — ABNORMAL HIGH (ref 70–99)
Potassium: 4.6 mmol/L (ref 3.5–5.2)
Sodium: 139 mmol/L (ref 134–144)
eGFR: 53 mL/min/{1.73_m2} — ABNORMAL LOW (ref >59)

## 2021-11-10 NOTE — Telephone Encounter (Signed)
The patient has been notified of the result and verbalized understanding.  All questions (if any) were answered. Antonieta Iba, RN 11/10/2021 1:09 PM  Cardiac MRI has been ordered.

## 2021-11-10 NOTE — Procedures (Signed)
     SLEEP STUDY REPORT Patient Information Study Date: 11/09/21 Patient Name: Phillip Duran Patient ID: [id number] Birth Date: 04-Jul-2045 Age: 76 Gender: Male BMI: 23.5 (W=150 lb, H=5' 7'') Referring Physician: Fransico Him, MD  TEST DESCRIPTION: Home sleep apnea testing was completed using the WatchPat, a Type 1 device, utilizing peripheral arterial tonometry (PAT), chest movement, actigraphy, pulse oximetry, pulse rate, body position and snore. AHI was calculated with apnea and hypopnea using valid sleep time as the denominator. RDI includes apneas, hypopneas, and RERAs. The data acquired and the scoring of sleep and all associated events were performed in accordance with the recommended standards and specifications as outlined in the AASM Manual for the Scoring of Sleep and Associated Events 2.2.0 (2015).   FINDINGS:  1. Mild Obstructive Sleep Apnea with AHI 8.4/hr.  2. Central Sleep Apnea with pAHIc  0.4/hr.  3. Oxygen desaturations as low as 81%.  4. Mild snoring was present. O2 sats were < 88% for 1.7 min.  5. Total sleep time was 8 hrs and 21 min.  6. 17.2% of total sleep time was spent in REM sleep.  7. Normal sleep onset latency at 19 min.  8. Shortened REM sleep onset latency at 31 min.  9. Total awakenings were 11.   DIAGNOSIS: Mild Obstructive Sleep Apnea (G47.33)  RECOMMENDATIONS:   1.  Clinical correlation of these findings is necessary.  The decision to treat obstructive sleep apnea (OSA) is usually based on the presence of apnea symptoms or the presence of associated medical conditions such as Hypertension, Congestive Heart Failure, Atrial Fibrillation or Obesity.  The most common symptoms of OSA are snoring, gasping for breath while sleeping, daytime sleepiness and fatigue.   2.  Initiating apnea therapy is recommended given the presence of symptoms and/or associated conditions. Recommend proceeding with one of the following:     a.  Auto-CPAP therapy with a  pressure range of 5-20cm H2O.     b.  An oral appliance (OA) that can be obtained from certain dentists with expertise in sleep medicine.  These are primarily of use in non-obese patients with mild and moderate disease.     c.  An ENT consultation which may be useful to look for specific causes of obstruction and possible treatment options.     d.  If patient is intolerant to PAP therapy, consider referral to ENT for evaluation for hypoglossal nerve stimulator.   3.  Close follow-up is necessary to ensure success with CPAP or oral appliance therapy for maximum benefit.  4.  A follow-up oximetry study on CPAP is recommended to assess the adequacy of therapy and determine the need for supplemental oxygen or the potential need for Bi-level therapy.  An arterial blood gas to determine the adequacy of baseline ventilation and oxygenation should also be considered.  5.  Healthy sleep recommendations include:  adequate nightly sleep (normal 7-9 hrs/night), avoidance of caffeine after noon and alcohol near bedtime, and maintaining a sleep environment that is cool, dark and quiet.  6.  Weight loss for overweight patients is recommended.  Even modest amounts of weight loss can significantly improve the severity of sleep apnea.  7.  Snoring recommendations include:  weight loss where appropriate, side sleeping, and avoidance of alcohol before bed.  8.  Operation of motor vehicle should be avoided when sleepy.  Signature:   Fransico Him, MD; [facility]; [place], [place] Board of Sleep Medicine Electronically Signed: 11/10/21  Page 2 of 4

## 2021-11-10 NOTE — Telephone Encounter (Signed)
-----   Message from Sueanne Margarita, MD sent at 11/10/2021  8:55 AM EDT ----- Percival Spanish please order a cardiac MRI with gad on this patient for DCM.  You will need to check with EP if his pacemaker is MRI compatible ----- Message ----- From: Baldwin [place], PA-C Sent: 11/09/2021   6:54 PM EDT To: Sueanne Margarita, MD  No CAD.   see you planned more eval if no CAD, thank you for seeing him.   When I checked his device he had an underlying SB but when I checked thresholds and lost capture he had no R spontaneous R waves.  100% RV paced.

## 2021-11-11 ENCOUNTER — Encounter: Payer: Self-pay | Admitting: Cardiology

## 2021-11-11 DIAGNOSIS — I700 Atherosclerosis of aorta: Secondary | ICD-10-CM | POA: Insufficient documentation

## 2021-11-11 NOTE — Telephone Encounter (Signed)
Opened in error

## 2021-11-15 ENCOUNTER — Telehealth: Payer: Self-pay | Admitting: Cardiology

## 2021-11-15 NOTE — Telephone Encounter (Signed)
Spoke with the patient's wife who states that the patient has been having issues with diarrhea recently. She states that he saw his PCP who did some testing and ended up stopping his magnesium. She states diarrhea had resolved after stopping the magnesium. However since starting on spironolactone patient is once again having persistent diarrhea. She would like to know if he can stop taking it and if he needs to be on something else instead.

## 2021-11-15 NOTE — Telephone Encounter (Signed)
Pt c/o medication issue:  1. Name of Medication: spironolactone (ALDACTONE) 25 MG tablet  2. How are you currently taking this medication (dosage and times per day)? Half a tablet daily  3. Are you having a reaction (difficulty breathing--STAT)? no  4. What is your medication issue? Patient's wife says the patient is having diarrhea and they think it is from the spironolactone.

## 2021-11-15 NOTE — Telephone Encounter (Signed)
Left message for patient with recommendations from Dr. Radford Pax. Advised to call back with any questions.

## 2021-11-18 ENCOUNTER — Telehealth: Payer: Self-pay | Admitting: *Deleted

## 2021-11-18 DIAGNOSIS — I10 Essential (primary) hypertension: Secondary | ICD-10-CM

## 2021-11-18 DIAGNOSIS — G4733 Obstructive sleep apnea (adult) (pediatric): Secondary | ICD-10-CM

## 2021-11-18 NOTE — Telephone Encounter (Signed)
-----   Message from Lauralee Evener, [place] sent at 11/11/2021  8:52 AM EDT -----  ----- Message ----- From: Sueanne Margarita, MD Sent: 11/10/2021   2:57 PM EDT To: Cv Div Sleep Studies  Please let patient know that they have sleep apnea and recommend treating with CPAP.  Please order an auto CPAP from 4-15cm H2O with heated humidity and mask of choice.  Order overnight pulse ox on CPAP.  Followup with me in 6 weeks.

## 2021-11-18 NOTE — Telephone Encounter (Signed)
The patient has been notified of the result and verbalized understanding.  All questions (if any) were answered. Phillip Duran, [place] 11/18/2021 9:26 AM    Upon patient request DME selection is [facility]. Patient understands he will be contacted by [facility] to set up his cpap. Patient understands to call if [facility] does not contact him with new setup in a timely manner. Patient understands they will be called once confirmation has been received from [facility]/ that they have received their new machine to schedule 10 week follow up appointment.   [facility] notified of new cpap order  Please add to airview Patient was grateful for the call and thanked me.

## 2021-11-27 DIAGNOSIS — I5020 Unspecified systolic (congestive) heart failure: Secondary | ICD-10-CM | POA: Insufficient documentation

## 2021-11-27 NOTE — Progress Notes (Signed)
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 Small hiatal hernia. 2. Aortic Atherosclerosis (ICD10-I70.0).  IMPRESSION: 1. Coronary calcium score of 0. This was 0 percentile for age-, race-, and sex-matched controls. 2. Normal coronary origin with right dominance. 3. No evidence of CAD. 4. Aortic atherosclerosis.   History of Present Illness:   Phillip Duran is a 76 y.o. male with the above problem list.  He was recently established with Dr. Radford Pax for newly dx cardiomyopathy and hypertension. His Toprol was changed to Coreg. He was started on Spironolactone. 49 Hr BP monitor and home sleep study was arranged. CCTA showed no CAD. CMR is pending. BP monitor is also pending. Sleep study showed mild OSA. CPAP is being arranged. He returns for further titration of GDMT.  He is here today with his wife.  He has not had chest pain, shortness of breath, orthopnea, leg edema, syncope.  He has had significant issues with diarrhea since July.  His PCP did a full work-up.  C. difficile, hepatitis panel, LFTs were normal.  His wife notes he also had a fecal sample done that was normal.  He has multiple stools a day.  They are watery.  He has a lot of  abdominal pain.  He has had nausea but no vomiting.  Of note, he had a recent insurance screen at home that diagnosed him with PAD.  He did not have any testing;  only an exam.  His pulses on exam today are good.  He has no claudication.  I asked him to send [place] a copy of the health screen.  If this shows PAD, we can set him up for ABIs.    Past Medical History:  Diagnosis Date   Anxiety    Aortic atherosclerosis ([place])    Arthritis    Complication of anesthesia    "hard time waking up"   Depression    Diverticulitis    Dysrhythmia    2:1 AV Block - pacemaker insertion by Dr. Lovena Le    Headache    Hypertension    Pneumonia    aspiration pneumonia   Presence of permanent cardiac pacemaker    managed by Dr. Lovena Le, [facility]   Prostate cancer [facility])    Current Medications: Current Meds  Medication Sig   carvedilol (COREG) 6.25 MG tablet Take 1 tablet (6.25 mg total) by mouth 2 (two) times daily.   cholecalciferol (VITAMIN D3) 25 MCG (1000 UNIT) tablet Take 1,000 Units by mouth daily.   cloNIDine (CATAPRES) 0.1 MG tablet Take 0.1 mg by mouth 2 (two) times daily as needed. IF SBP over 160   escitalopram (LEXAPRO) 10 MG tablet Take 10 mg by mouth daily.   riboflavin (VITAMIN B-2)  100 MG TABS tablet Take 400 mg by mouth daily.   sacubitril-valsartan (ENTRESTO) 24-26 MG Take 1 tablet by mouth 2 (two) times daily.    Allergies:   Morphine and related and Other   Social History   Tobacco Use   Smoking status: Never   Smokeless tobacco: Never  Vaping Use   Vaping Use: Never used  Substance Use Topics   Alcohol use: Yes    Alcohol/week: 3.0 standard drinks of alcohol    Types: 3 Cans of beer per week   Drug use: No    Family Hx: The patient's family history includes Hypertension in his father; Stroke in his mother, sister, and sister.  Review of Systems  Constitutional: Negative for fever.  Cardiovascular:  Negative for claudication.  Gastrointestinal:  Positive for  heartburn. Negative for hematochezia and melena.     EKGs/Labs/Other Test Reviewed:    EKG:  EKG is not ordered today.  The ekg ordered today demonstrates n/a  Recent Labs: 11/10/2021: BUN 20; Creatinine, Ser 1.39; Potassium 4.6; Sodium 139   Recent Lipid Panel No results for input(s): "CHOL", "TRIG", "HDL", "VLDL", "LDLCALC", "LDLDIRECT" in the last 8760 hours.   Risk Assessment/Calculations/Metrics:     STOP-Bang Score:  5           Physical Exam:    VS:  BP 138/80   Pulse 60   Ht 5' 7"  (1.702 m)   Wt 151 lb 3.2 oz (68.6 kg)   SpO2 100%   BMI 23.68 kg/m     Wt Readings from Last 3 Encounters:  11/28/21 151 lb 3.2 oz (68.6 kg)  11/03/21 150 lb 12.8 oz (68.4 kg)  10/12/21 150 lb (68 kg)    Constitutional:      Appearance: Healthy appearance. Not in distress.  Neck:     Vascular: JVD normal.  Pulmonary:     Effort: Pulmonary effort is normal.     Breath sounds: No wheezing. No rales.  Cardiovascular:     Normal rate. Regular rhythm. Normal S1. Normal S2.      Murmurs: There is no murmur.  Pulses:    Intact distal pulses.  Edema:    Peripheral edema absent.  Abdominal:     General: Bowel sounds are normal. There is no distension.     Palpations: Abdomen is soft.     Tenderness: There is no abdominal tenderness.  Skin:    General: Skin is warm and dry.  Neurological:     General: No focal deficit present.     Mental Status: Alert and oriented to person, place and time.         ASSESSMENT & PLAN:   HFrEF (heart failure with reduced ejection fraction) ([place]) EF 25-30 by echo in August 2023.  CCTA negative for CAD or coronary calcification.  Therefore, he has a nonischemic cardiomyopathy.  Cardiac MRI is pending.  NYHA I-II.  Volume status stable.  He has had significant issues with diarrhea.  These symptoms started before his medication changes for heart failure.  He recently stopped spironolactone due to diarrhea.  However, he did not notice much difference with  stopping spironolactone.  Given his current GI issues, I am hesitant to adjust his medications further.  I think he needs referral to GI first.  I will try to see him back in a few weeks.  If he has better at that time, we can try initiating SGLT2 inhibitor.  Given chronic kidney disease and diarrhea, obtain follow-up CMET  today.  Pacemaker Follow-up with EP as planned.  Aortic atherosclerosis ([place]) Noted on CT scan.  As noted, I will avoid any medication changes.  Eventually, he will need aspirin, statin therapy initiated.  HTN (hypertension) Blood pressure controlled.  Continue Coreg 6.25 mg twice daily, Entresto 24/26 mg twice daily.  Diarrhea He had a full work-up with primary care that was unremarkable.  Etiology for his diarrhea is not clear.  I will refer him to gastroenterology.  He previously saw Dr. Melina Copa in [place] but prefers to see someone in [place].  I have advised him to eliminate dairy from his diet is much as possible.  We also discussed a BRAT diet for now.  Obtain follow-up CMET today.  CKD (chronic kidney disease) stage 3, GFR 30-59 ml/min (HCC) Obtain follow-up CMET today.            Dispo:  No follow-ups on file.   Medication Adjustments/Labs and Tests Ordered: Current medicines are reviewed at length with the patient today.  Concerns regarding medicines are outlined above.  Tests Ordered: Orders Placed This Encounter  Procedures   Comp Met (CMET)   Ambulatory referral to Gastroenterology   Medication Changes: No orders of the defined types were placed in this encounter.  Signed, Richardson Dopp, PA-C  11/28/2021 8:47 AM    [facility], [place], [place]  [address] Phone: [phone]; Fax: [phone]

## 2021-11-28 ENCOUNTER — Ambulatory Visit: Payer: Medicare PPO | Attending: Physician Assistant | Admitting: Physician Assistant

## 2021-11-28 ENCOUNTER — Encounter: Payer: Self-pay | Admitting: Physician Assistant

## 2021-11-28 VITALS — BP 138/80 | HR 60 | Ht 67.0 in | Wt 151.2 lb

## 2021-11-28 DIAGNOSIS — I10 Essential (primary) hypertension: Secondary | ICD-10-CM

## 2021-11-28 DIAGNOSIS — R197 Diarrhea, unspecified: Secondary | ICD-10-CM | POA: Diagnosis not present

## 2021-11-28 DIAGNOSIS — Z950 Presence of cardiac pacemaker: Secondary | ICD-10-CM

## 2021-11-28 DIAGNOSIS — N1831 Chronic kidney disease, stage 3a: Secondary | ICD-10-CM

## 2021-11-28 DIAGNOSIS — R110 Nausea: Secondary | ICD-10-CM

## 2021-11-28 DIAGNOSIS — I700 Atherosclerosis of aorta: Secondary | ICD-10-CM

## 2021-11-28 DIAGNOSIS — N183 Chronic kidney disease, stage 3 unspecified: Secondary | ICD-10-CM | POA: Insufficient documentation

## 2021-11-28 DIAGNOSIS — I5020 Unspecified systolic (congestive) heart failure: Secondary | ICD-10-CM

## 2021-11-28 LAB — COMPREHENSIVE METABOLIC PANEL
ALT: 25 IU/L (ref 0–44)
AST: 29 IU/L (ref 0–40)
Albumin/Globulin Ratio: 2 (ref 1.2–2.2)
Albumin: 4.1 g/dL (ref 3.8–4.8)
Alkaline Phosphatase: 74 IU/L (ref 44–121)
BUN/Creatinine Ratio: 11 (ref 10–24)
BUN: 14 mg/dL (ref 8–27)
Bilirubin Total: 0.4 mg/dL (ref 0.0–1.2)
CO2: 24 mmol/L (ref 20–29)
Calcium: 8.6 mg/dL (ref 8.6–10.2)
Chloride: 104 mmol/L (ref 96–106)
Creatinine, Ser: 1.3 mg/dL — ABNORMAL HIGH (ref 0.76–1.27)
Globulin, Total: 2.1 g/dL (ref 1.5–4.5)
Glucose: 106 mg/dL — ABNORMAL HIGH (ref 70–99)
Potassium: 4.4 mmol/L (ref 3.5–5.2)
Sodium: 140 mmol/L (ref 134–144)
Total Protein: 6.2 g/dL (ref 6.0–8.5)
eGFR: 57 mL/min/{1.73_m2} — ABNORMAL LOW (ref >59)

## 2021-11-28 NOTE — Assessment & Plan Note (Signed)
Noted on CT scan.  As noted, I will avoid any medication changes.  Eventually, he will need aspirin, statin therapy initiated.

## 2021-11-28 NOTE — Assessment & Plan Note (Signed)
Follow up with EP as planned. 

## 2021-11-28 NOTE — Assessment & Plan Note (Addendum)
EF 25-30 by echo in August 2023.  CCTA negative for CAD or coronary calcification.  Therefore, he has a nonischemic cardiomyopathy.  Cardiac MRI is pending.  NYHA I-II.  Volume status stable.  He has had significant issues with diarrhea.  These symptoms started before his medication changes for heart failure.  He recently stopped spironolactone due to diarrhea.  However, he did not notice much difference with stopping spironolactone.  Given his current GI issues, I am hesitant to adjust his medications further.  I think he needs referral to GI first.  I will try to see him back in a few weeks.  If he has better at that time, we can try initiating SGLT2 inhibitor.  Given chronic kidney disease and diarrhea, obtain follow-up CMET today.

## 2021-11-28 NOTE — Assessment & Plan Note (Signed)
Obtain follow-up CMET today. 

## 2021-11-28 NOTE — Patient Instructions (Signed)
Medication Instructions:  Your physician recommends that you continue on your current medications as directed. Please refer to the Current Medication list given to you today.  *If you need a refill on your cardiac medications before your next appointment, please call your pharmacy*   Lab Work: TODAY:  CMET  If you have labs (blood work) drawn today and your tests are completely normal, you will receive your results only by: [place] (if you have MyChart) OR A paper copy in the mail If you have any lab test that is abnormal or we need to change your treatment, we will call you to review the results.   Testing/Procedures: None ordered   Follow-Up: At [facility], you and your health needs are our priority.  As part of our continuing mission to provide you with exceptional heart care, we have created designated Provider Care Teams.  These Care Teams include your primary Cardiologist (physician) and Advanced Practice Providers (APPs -  Physician Assistants and Nurse Practitioners) who all work together to provide you with the care you need, when you need it.  We recommend signing up for the patient portal called "MyChart".  Sign up information is provided on this After Visit Summary.  MyChart is used to connect with patients for Virtual Visits (Telemedicine).  Patients are able to view lab/test results, encounter notes, upcoming appointments, etc.  Non-urgent messages can be sent to your provider as well.   To learn more about what you can do with MyChart, go to [email or web address].    Your next appointment:   12/13/21 ARRIVE AT 8:10  The format for your next appointment:   In Person  Provider:   Richardson Dopp, PA-C  Other Instructions Decrease your dairy intake  Try the BRAT Diet   Important Information About Sugar

## 2021-11-28 NOTE — Assessment & Plan Note (Addendum)
He had a full work-up with primary care that was unremarkable.  Etiology for his diarrhea is not clear.  I will refer him to gastroenterology.  He previously saw Dr. Melina Copa in [place] but prefers to see someone in [place].  I have advised him to eliminate dairy from his diet is much as possible.  We also discussed a BRAT diet for now.  Obtain follow-up CMET today.

## 2021-11-28 NOTE — Assessment & Plan Note (Signed)
Blood pressure controlled.  Continue Coreg 6.25 mg twice daily, Entresto 24/26 mg twice daily.

## 2021-12-02 NOTE — Progress Notes (Signed)
Pt has been made aware of normal result and verbalized understanding.  jw   He asked about the referral to GI.  I see where comments were made on the referral, but pt states he hasn't heard from anyone since then.  I will fwd to Webb Laws @ GI to see the status of referral and see if someone can contact pt.

## 2021-12-11 NOTE — Progress Notes (Unsigned)
Cardiology Office Note:    Date:  12/11/2021   ID:  Phillip Duran, DOB May 30, 1945, MRN [id number]  PCP:  Phillip Duran, [facility] Cardiologist:  Phillip Him, MD Electrophysiologist:  Phillip Peru, MD { Click to update primary MD,subspecialty MD or APP then REFRESH:1}  *** Referring MD: Phillip Berger, MD   Chief Complaint:  No chief complaint on file. 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 Small hiatal hernia. 2. Aortic Atherosclerosis (ICD10-I70.0).   IMPRESSION: 1. Coronary calcium score of 0. This was 0 percentile for age-, race-, and sex-matched controls. 2. Normal coronary origin with right dominance. 3. No evidence of CAD. 4. Aortic atherosclerosis.  History of Present Illness:   Phillip Duran is a 76 y.o. male with the above problem list.  He was last seen 11/28/21 for further titration of GDMT for (HFrEF) heart failure with reduced ejection fraction. However, he has been having significant diarrhea that has been ongoing for quite some time. I have referred Duran to GI. He returns for f/u.***        Past Medical History:  Diagnosis Date   Anxiety    Aortic atherosclerosis (HCC)    Arthritis    Complication of anesthesia    "hard time waking up"   Depression    Diverticulitis    Dysrhythmia    2:1 AV Block - pacemaker insertion by Dr. Lovena Le    Headache    Hypertension    Pneumonia    aspiration pneumonia   Presence of  permanent cardiac pacemaker    managed by Dr. Lovena Le, [facility]   Prostate cancer [facility])    Current Medications: No outpatient medications have been marked as taking for the 12/13/21 encounter (Appointment) with Richardson Dopp T, PA-C.    Allergies:   Morphine and related and Other   Social History   Tobacco Use   Smoking status: Never   Smokeless tobacco: Never  Vaping Use   Vaping Use: Never used  Substance Use Topics   Alcohol use: Yes    Alcohol/week: 3.0 standard drinks of alcohol    Types: 3 Cans of beer per week   Drug use: No    Family Hx: The patient's family history includes Hypertension in his father; Stroke in his mother, sister, and sister.  ROS   EKGs/Labs/Other Test Reviewed:    EKG:  EKG is *** ordered today.  The ekg ordered today demonstrates ***  Recent Labs: 11/28/2021: ALT 25; BUN 14; Creatinine, Ser 1.30; Potassium 4.4; Sodium 140   Recent Lipid Panel No results for input(s): "CHOL", "TRIG", "HDL", "VLDL", "LDLCALC", "LDLDIRECT" in the last 8760 hours.   Risk Assessment/Calculations/Metrics:   {Does this patient have ATRIAL FIBRILLATION?[phone]} STOP-Bang Score:  5  { Consider Dx Sleep Disordered Breathing or Sleep Apnea  ICD G47.33          :1}    No BP recorded.  {Refresh Note OR Click here to enter BP  :  1}***    Physical Exam:    VS:  There were no vitals taken for this visit.    Wt Readings from Last 3 Encounters:  11/28/21 151 lb 3.2 oz (68.6 kg)  11/03/21 150 lb 12.8 oz (68.4 kg)  10/12/21 150 lb (68 kg)    Physical Exam ***     ASSESSMENT & PLAN:   No problem-specific Assessment & Plan notes found for this encounter.  ***HFrEF (heart failure with reduced ejection fraction) ([place]) EF 25-30 by echo in August 2023.  CCTA negative for CAD or coronary calcification.  Therefore, he has a nonischemic cardiomyopathy.  Cardiac MRI is pending.  NYHA I-II.  Volume status stable.  He has had significant issues with diarrhea.  These  symptoms started before his medication changes for heart failure.  He recently stopped spironolactone due to diarrhea.  However, he did not notice much difference with stopping spironolactone.  Given his current GI issues, I am hesitant to adjust his medications further.  I think he needs referral to GI first.  I will try to see Duran back in a few weeks.  If he has better at that time, we can try initiating SGLT2 inhibitor.  Given chronic kidney disease and diarrhea, obtain follow-up CMET today.   Pacemaker Follow-up with EP as planned.   Aortic atherosclerosis ([place]) Noted on CT scan.  As noted, I will avoid any medication changes.  Eventually, he will need aspirin, statin therapy initiated.   HTN (hypertension) Blood pressure controlled.  Continue Coreg 6.25 mg twice daily, Entresto 24/26 mg twice daily.   Diarrhea He had a full work-up with primary care that was unremarkable.  Etiology for his diarrhea is not clear.  I will refer Duran to gastroenterology.  He previously saw Dr. Melina Copa in [place] but prefers to see someone in [place].  I have advised Duran to eliminate dairy from his diet is much as possible.  We also discussed a BRAT diet for now.  Obtain follow-up CMET today.   CKD (chronic kidney disease) stage 3, GFR 30-59 ml/min (HCC) Obtain follow-up CMET today.***      {Are you ordering a CV Procedure (e.g. stress test, cath, DCCV, TEE, etc)?   Press F2        :[id number]}   Dispo:  No follow-ups on file.   Medication Adjustments/Labs and Tests Ordered: Current medicines are reviewed at length with the patient today.  Concerns regarding medicines are outlined above.  Tests Ordered: No orders of the defined types were placed in this encounter.  Medication Changes: No orders of the defined types were placed in this encounter.  Signed, Richardson Dopp, PA-C  12/11/2021 10:09 PM    [facility] [place], [place], [place]  [address] Phone: [phone]; Fax: [phone]

## 2021-12-13 ENCOUNTER — Encounter: Payer: Self-pay | Admitting: Physician Assistant

## 2021-12-13 ENCOUNTER — Ambulatory Visit: Payer: Medicare PPO | Attending: Physician Assistant | Admitting: Physician Assistant

## 2021-12-13 VITALS — BP 160/90 | HR 75 | Ht 67.0 in | Wt 150.4 lb

## 2021-12-13 DIAGNOSIS — Z950 Presence of cardiac pacemaker: Secondary | ICD-10-CM | POA: Diagnosis not present

## 2021-12-13 DIAGNOSIS — I5020 Unspecified systolic (congestive) heart failure: Secondary | ICD-10-CM | POA: Diagnosis not present

## 2021-12-13 DIAGNOSIS — I10 Essential (primary) hypertension: Secondary | ICD-10-CM | POA: Diagnosis not present

## 2021-12-13 DIAGNOSIS — N1831 Chronic kidney disease, stage 3a: Secondary | ICD-10-CM

## 2021-12-13 MED ORDER — BISOPROLOL FUMARATE 5 MG PO TABS: 2.5000 mg | ORAL_TABLET | Freq: Every day | ORAL | 1 refills | Status: DC

## 2021-12-13 MED ORDER — ENTRESTO 49-51 MG PO TABS
1.0000 | ORAL_TABLET | Freq: Two times a day (BID) | ORAL | 3 refills | Status: DC
Start: 2021-12-13 — End: 2021-12-13

## 2021-12-13 MED ORDER — ENTRESTO 49-51 MG PO TABS: 1.0000 | ORAL_TABLET | Freq: Two times a day (BID) | ORAL | 3 refills | Status: DC

## 2021-12-13 NOTE — Assessment & Plan Note (Signed)
Follow up with EP as planned. 

## 2021-12-13 NOTE — Assessment & Plan Note (Signed)
As noted, blood pressure uncontrolled.  Increase Entresto to 49/51 mg twice daily as noted.  Trial bisoprolol 2.5 mg daily as outlined.  Consider resuming spironolactone.

## 2021-12-13 NOTE — Assessment & Plan Note (Signed)
Recent creatinine stable.  With medication adjustments, he will need a follow-up BMET at his next visit with Dr. Radford Pax.

## 2021-12-13 NOTE — Assessment & Plan Note (Addendum)
Nonischemic cardiomyopathy.  EF 25-30 by echocardiogram in August 2023.  NYHA I-II.  Volume status stable.  Unfortunately, he stopped all of his medications except Entresto.  We discussed the dangers of stopping medications like beta-blockers and SSRIs without taper.  He can discuss whether or not to resume his SSRI with primary care.  Question if he has an intolerance to beta-blockers due to bradycardia.  The incidence of diarrhea is worst with metoprolol succinate.  There is some increased incidence of diarrhea with bisoprolol.  We discussed trying a different beta-blocker to see if he can tolerate it.  His blood pressure is also uncontrolled.  I have also recommended increasing his dose of Entresto.  He previously stopped spironolactone because of diarrhea.  However, his diarrhea did not resolve with stopping this medication.  Therefore, we could certainly try this again in the future. Increase Entresto to 49/51 mg twice daily After 3 days, try bisoprolol 2.5 mg daily If diarrhea occurs with bisoprolol, discontinue and notify me Patient to send blood pressure readings in 2 weeks After 2 weeks, if BP can tolerate, restart Spironolactone 12.5 mg once daily  Keep f/u for CMR later this month and Dr. Radford Pax next month  Eventually try to start SGLT2 inhibitor

## 2021-12-13 NOTE — Patient Instructions (Signed)
Medication Instructions:  Your physician has recommended you make the following change in your medication:  INCREASE Entresto to 49/51 twice a day STARTG Bisoprolol 5 mg taking 1/2 tablet daily YOU CAN START THIS 3 DAYS AFTER YOU INCREASE THE ENTRESTO  *If you need a refill on your cardiac medications before your next appointment, please call your pharmacy*   Lab Work: None ordered  If you have labs (blood work) drawn today and your tests are completely normal, you will receive your results only by: [place] (if you have MyChart) OR A paper copy in the mail If you have any lab test that is abnormal or we need to change your treatment, we will call you to review the results.   Testing/Procedures: None ordered   Follow-Up: At [facility], you and your health needs are our priority.  As part of our continuing mission to provide you with exceptional heart care, we have created designated Provider Care Teams.  These Care Teams include your primary Cardiologist (physician) and Advanced Practice Providers (APPs -  Physician Assistants and Nurse Practitioners) who all work together to provide you with the care you need, when you need it.  We recommend signing up for the patient portal called "MyChart".  Sign up information is provided on this After Visit Summary.  MyChart is used to connect with patients for Virtual Visits (Telemedicine).  Patients are able to view lab/test results, encounter notes, upcoming appointments, etc.  Non-urgent messages can be sent to your provider as well.   To learn more about what you can do with MyChart, go to [email or web address].    Your next appointment:   AS SCHEDULED   The format for your next appointment:   In Person  Provider:   Fransico Him, MD     Other Instructions Please let us know if you have any diarrhea once you start Bisoprolol    Your physician has requested that you regularly monitor and record your blood pressure  readings at home. Please use the same machine at the same time of day to check your readings and record them to bring to your follow-up visit.   Please monitor blood pressures and keep a log of your readings for 2 weeks then send in readings or call them in.    Make sure to check 2 hours after your medications.    AVOID these things for 30 minutes before checking your blood pressure: No Drinking caffeine. No Drinking alcohol. No Eating. No Smoking. No Exercising.   Five minutes before checking your blood pressure: Pee. Sit in a dining chair. Avoid sitting in a soft couch or armchair. Be quiet. Do not talk   Important Information About Sugar

## 2021-12-20 ENCOUNTER — Ambulatory Visit (INDEPENDENT_AMBULATORY_CARE_PROVIDER_SITE_OTHER): Payer: Medicare PPO

## 2021-12-20 DIAGNOSIS — I441 Atrioventricular block, second degree: Secondary | ICD-10-CM

## 2021-12-20 NOTE — Telephone Encounter (Signed)
Fax came for patient from a [facility] stating the Over night pulse ox test for him has been cancelled because they have not been able to contact the patient. Reached out to the patient and he states his mask does not fit properly and it shoots water up his nose and he is not going to wear it. He is not going to do the overnight pulse ox  test because he does not snore and does not think he has sleep apnea.

## 2021-12-22 ENCOUNTER — Other Ambulatory Visit: Payer: Self-pay | Admitting: Cardiology

## 2021-12-22 DIAGNOSIS — I441 Atrioventricular block, second degree: Secondary | ICD-10-CM

## 2021-12-22 DIAGNOSIS — R030 Elevated blood-pressure reading, without diagnosis of hypertension: Secondary | ICD-10-CM

## 2021-12-22 DIAGNOSIS — I10 Essential (primary) hypertension: Secondary | ICD-10-CM

## 2021-12-22 DIAGNOSIS — I420 Dilated cardiomyopathy: Secondary | ICD-10-CM

## 2021-12-22 LAB — CUP PACEART REMOTE DEVICE CHECK
Battery Remaining Longevity: 60 mo
Battery Remaining Percentage: 60 %
Brady Statistic RA Percent Paced: 55 %
Brady Statistic RV Percent Paced: 100 %
Date Time Interrogation Session: 20231019130600
Implantable Lead Implant Date: 20180125
Implantable Lead Implant Date: 20180125
Implantable Lead Location: 753859
Implantable Lead Location: 753860
Implantable Lead Model: 7740
Implantable Lead Model: 7741
Implantable Lead Serial Number: 693139
Implantable Lead Serial Number: 851154
Implantable Pulse Generator Implant Date: 20180125
Lead Channel Impedance Value: 534 Ohm
Lead Channel Impedance Value: 569 Ohm
Lead Channel Pacing Threshold Amplitude: 0.4 V
Lead Channel Pacing Threshold Amplitude: 1.6 V
Lead Channel Pacing Threshold Pulse Width: 0.4 ms
Lead Channel Pacing Threshold Pulse Width: 0.4 ms
Lead Channel Setting Pacing Amplitude: 2 V
Lead Channel Setting Pacing Amplitude: 3 V
Lead Channel Setting Pacing Pulse Width: 0.4 ms
Lead Channel Setting Sensing Sensitivity: 2.5 mV
Pulse Gen Serial Number: 773204

## 2021-12-23 ENCOUNTER — Telehealth (HOSPITAL_COMMUNITY): Payer: Self-pay | Admitting: *Deleted

## 2021-12-23 NOTE — Telephone Encounter (Signed)
Attempted to call patient regarding upcoming cardiac MRI appointment. Left message with wife with name and call back.  Gordy Clement RN Navigator Cardiac Imaging [facility] [phone] Office [phone] Cell

## 2021-12-26 ENCOUNTER — Ambulatory Visit (HOSPITAL_COMMUNITY)
Admission: RE | Admit: 2021-12-26 | Discharge: 2021-12-26 | Disposition: A | Discharge status: Home / Self Care | Payer: Medicare PPO | Source: Ambulatory Visit | Attending: Cardiology | Admitting: Cardiology

## 2021-12-26 ENCOUNTER — Other Ambulatory Visit: Payer: Self-pay | Admitting: Cardiology

## 2021-12-26 DIAGNOSIS — I420 Dilated cardiomyopathy: Secondary | ICD-10-CM

## 2021-12-26 MED ORDER — GADOBUTROL 1 MMOL/ML IV SOLN
10.0000 mL | Freq: Once | INTRAVENOUS | Status: AC | PRN
  Administered 2021-12-26: 10 mL via INTRAVENOUS

## 2021-12-26 NOTE — Progress Notes (Signed)
Called Phillip Duran through [employer] from [employer].  Programmed to DOO 75.  Will reprogram the device to pre scan after scan complete

## 2021-12-30 ENCOUNTER — Telehealth: Payer: Self-pay

## 2021-12-30 DIAGNOSIS — I5020 Unspecified systolic (congestive) heart failure: Secondary | ICD-10-CM

## 2021-12-30 MED ORDER — EMPAGLIFLOZIN 10 MG PO TABS: 10.0000 mg | ORAL_TABLET | Freq: Every day | ORAL | 3 refills | Status: DC

## 2021-12-30 MED ORDER — SPIRONOLACTONE 25 MG PO TABS: 12.5000 mg | ORAL_TABLET | Freq: Every day | ORAL | 3 refills | Status: AC

## 2021-12-30 NOTE — Telephone Encounter (Signed)
The patient has been notified of the result and verbalized understanding.  All questions (if any) were answered. Bernestine Amass, RN 12/30/2021 4:40 PM  New medications have been sent in. Labs have been scheduled. Message sent to EP scheduler to get patient an appointment with Dr. Lovena Le for CRT upgrade.

## 2021-12-30 NOTE — Telephone Encounter (Signed)
-----   Message from Sueanne Margarita, MD sent at 12/26/2021  4:28 PM EDT ----- cMRI showed moderate global LV dysfunction with EF 31% and low normal RVF, mild LA enlargement with no evidence of infiltrative disease.  Continue Entresto and add Arlyce Harman 12.'5mg'$  daily and add Jardiance '10mg'$  daily.  Check BMET in 1 week.  refer to EP to consider CRT upgrade

## 2022-01-04 NOTE — Progress Notes (Signed)
Remote pacemaker transmission.   

## 2022-01-05 ENCOUNTER — Ambulatory Visit: Payer: Medicare PPO

## 2022-01-05 ENCOUNTER — Ambulatory Visit: Payer: Medicare PPO | Attending: Cardiology

## 2022-01-05 DIAGNOSIS — I420 Dilated cardiomyopathy: Secondary | ICD-10-CM

## 2022-01-05 DIAGNOSIS — I10 Essential (primary) hypertension: Secondary | ICD-10-CM | POA: Diagnosis not present

## 2022-01-05 DIAGNOSIS — I441 Atrioventricular block, second degree: Secondary | ICD-10-CM

## 2022-01-05 DIAGNOSIS — R030 Elevated blood-pressure reading, without diagnosis of hypertension: Secondary | ICD-10-CM

## 2022-01-05 DIAGNOSIS — I5020 Unspecified systolic (congestive) heart failure: Secondary | ICD-10-CM

## 2022-01-05 LAB — BASIC METABOLIC PANEL
BUN/Creatinine Ratio: 13 (ref 10–24)
BUN: 18 mg/dL (ref 8–27)
CO2: 25 mmol/L (ref 20–29)
Calcium: 9.3 mg/dL (ref 8.6–10.2)
Chloride: 104 mmol/L (ref 96–106)
Creatinine, Ser: 1.37 mg/dL — ABNORMAL HIGH (ref 0.76–1.27)
Glucose: 101 mg/dL — ABNORMAL HIGH (ref 70–99)
Potassium: 4.6 mmol/L (ref 3.5–5.2)
Sodium: 138 mmol/L (ref 134–144)
eGFR: 54 mL/min/{1.73_m2} — ABNORMAL LOW (ref >59)

## 2022-01-05 NOTE — Progress Notes (Unsigned)
24-48 hour ambulatory blood pressure monitor applied to patients left arm using standard adult cuff.

## 2022-01-09 ENCOUNTER — Encounter: Payer: Self-pay | Admitting: Cardiology

## 2022-01-09 ENCOUNTER — Ambulatory Visit: Payer: Medicare PPO | Attending: Cardiology | Admitting: Cardiology

## 2022-01-09 VITALS — BP 116/64 | HR 65 | Ht 67.0 in | Wt 146.0 lb

## 2022-01-09 DIAGNOSIS — I5020 Unspecified systolic (congestive) heart failure: Secondary | ICD-10-CM | POA: Diagnosis not present

## 2022-01-09 DIAGNOSIS — R197 Diarrhea, unspecified: Secondary | ICD-10-CM

## 2022-01-09 DIAGNOSIS — I441 Atrioventricular block, second degree: Secondary | ICD-10-CM

## 2022-01-09 DIAGNOSIS — G4733 Obstructive sleep apnea (adult) (pediatric): Secondary | ICD-10-CM

## 2022-01-09 DIAGNOSIS — I420 Dilated cardiomyopathy: Secondary | ICD-10-CM | POA: Diagnosis not present

## 2022-01-09 DIAGNOSIS — I10 Essential (primary) hypertension: Secondary | ICD-10-CM

## 2022-01-09 LAB — BASIC METABOLIC PANEL
BUN/Creatinine Ratio: 17 (ref 10–24)
BUN: 23 mg/dL (ref 8–27)
CO2: 22 mmol/L (ref 20–29)
Calcium: 9 mg/dL (ref 8.6–10.2)
Chloride: 107 mmol/L — ABNORMAL HIGH (ref 96–106)
Creatinine, Ser: 1.38 mg/dL — ABNORMAL HIGH (ref 0.76–1.27)
Glucose: 114 mg/dL — ABNORMAL HIGH (ref 70–99)
Potassium: 4.8 mmol/L (ref 3.5–5.2)
Sodium: 141 mmol/L (ref 134–144)
eGFR: 53 mL/min/{1.73_m2} — ABNORMAL LOW (ref >59)

## 2022-01-09 NOTE — Telephone Encounter (Signed)
Error

## 2022-01-09 NOTE — Patient Instructions (Addendum)
Medication Instructions:  Your physician recommends that you continue on your current medications as directed. Please refer to the Current Medication list given to you today.  *If you need a refill on your cardiac medications before your next appointment, please call your pharmacy*   Lab Work: [place] If you have labs (blood work) drawn today and your tests are completely normal, you will receive your results only by: [place] (if you have MyChart) OR A paper copy in the mail If you have any lab test that is abnormal or we need to change your treatment, we will call you to review the results.   Follow-Up: At [facility], you and your health needs are our priority.  As part of our continuing mission to provide you with exceptional heart care, we have created designated Provider Care Teams.  These Care Teams include your primary Cardiologist (physician) and Advanced Practice Providers (APPs -  Physician Assistants and Nurse Practitioners) who all work together to provide you with the care you need, when you need it.  Your next appointment:   6 month(s)  The format for your next appointment:   In Person  Provider:   Fransico Him, MD     Important Information About Sugar

## 2022-01-09 NOTE — Progress Notes (Addendum)
Cardiology Office Note    Date:  01/09/2022   ID:  Phillip Duran, DOB 1945/08/01, MRN [id number]  PCP:  Maris Berger, MD  Cardiologist: None  Chief Complaint  Patient presents with   Hypertension   Sleep Apnea   Cardiomyopathy   Congestive Heart Failure    History of Present Illness:  Phillip Duran is a 76 y.o. 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  He was found to have a dilated cardiomyopathy with EF 20 to 25% with severe global hypokinesis. He is currently on Entresto and Toprol.  Coronary CTA done 11/04/2021 showed a coronary calcium score of 0 and normal coronary arteries with no evidence of CAD.  There was aortic atherosclerosis.    At last office visit he was continued on Entresto 24-26 mg twice daily and spironolactone 12.5 mg daily was added.  He underwent sleep study on 11/09/2021 showing mild obstructive sleep apnea with an AHI of 8.4/h and was started on auto CPAP from 4 to 15 cm H2O.  He has been seen back several times by Richardson Dopp, PA for up titration of heart failure medicines and currently is on Entresto 49-51 mg twice daily.  His spironolactone was stopped because of diarrhea.  He was also having problems with intolerance to his beta-blocker.  He was started on bisoprolol 2.5 mg daily.  He is now here for follow-up.  He is here today for followup and is doing well.  He denies any chest pain or pressure, SOB, DOE, PND, orthopnea, LE edema, dizziness (except when bending over a lot), palpitations or syncope. He is compliant with his meds and is tolerating meds with no SE.    He stopped using the CPAP as he felt like he was drowning and could not sleep with it.    Past Medical History:  Diagnosis Date   Anxiety    Aortic atherosclerosis ([place])    Arthritis    Complication of anesthesia    "hard time waking up"   Depression     Diverticulitis    Dysrhythmia    2:1 AV Block - pacemaker insertion by Dr. Lovena Le    Headache    Hypertension    Pneumonia    aspiration pneumonia   Presence of permanent cardiac pacemaker    managed by Dr. Lovena Le, [facility]   Prostate cancer [facility])     Past Surgical History:  Procedure Laterality Date   APPENDECTOMY     BACK [place]     in [place], [place] in the 1990's   COLONOSCOPY  03/30/2010   Moderate to severe sigmoid diverticulosis. Small internal hemorrhoids. Otherwise normal colonoscopy to TI.    EP IMPLANTABLE DEVICE N/A 03/30/2016   Procedure: Pacemaker Implant;  Surgeon: Evans Lance, MD;  Location: [facility];  Service: Cardiovascular;  Laterality: N/A;   HERNIA REPAIR     x3   KNEE ARTHROSCOPY Bilateral    NASAL SINUS SURGERY     eye patch on left side   TOTAL KNEE ARTHROPLASTY Right 05/21/2017   Procedure: RIGHT TOTAL KNEE ARTHROPLASTY;  Surgeon: Vickey Huger, MD;  Location: [facility];  Service: Orthopedics;  Laterality: Right;   VASECTOMY      Current Medications: Current Meds  Medication Sig   bisoprolol (ZEBETA) 5 MG tablet Take 0.5 tablets (2.5 mg total) by mouth daily.  empagliflozin (JARDIANCE) 10 MG TABS tablet Take 1 tablet (10 mg total) by mouth daily before breakfast.   sacubitril-valsartan (ENTRESTO) 49-51 MG Take 1 tablet by mouth 2 (two) times daily.   spironolactone (ALDACTONE) 25 MG tablet Take 0.5 tablets (12.5 mg total) by mouth daily.    Allergies:   Morphine and related and Other   Social History   Socioeconomic History   Marital status: Married    Spouse name: Not on file   Number of children: Not on file   Years of education: Not on file   Highest education level: Not on file  Occupational History   Not on file  Tobacco Use   Smoking status: Never   Smokeless tobacco: Never  Vaping Use   Vaping Use: Never used  Substance and Sexual Activity   Alcohol use: Yes    Alcohol/week: 3.0  standard drinks of alcohol    Types: 3 Cans of beer per week   Drug use: No   Sexual activity: Not on file  Other Topics Concern   Not on file  Social History Narrative   Not on file   Social Determinants of Health   Financial Resource Strain: Not on file  Food Insecurity: Not on file  Transportation Needs: Not on file  Physical Activity: Not on file  Stress: Not on file  Social Connections: Not on file     Family History:  The patient's Hefamily history includes Hypertension in his father; Stroke in his mother, sister, and sister.   ROS:   Please see the history of present illness.    ROS All other systems reviewed and are negative.     03/28/2016    1:08 PM  PAD Screen  Previous PAD dx? No  Previous surgical procedure? No  Pain with walking? No  Feet/toe relief with dangling? No  Painful, non-healing ulcers? No  Extremities discolored? No       PHYSICAL EXAM:   VS:  BP 116/64   Pulse 65   Ht '5\' 7"'$  (1.702 m)   Wt 146 lb (66.2 kg)   SpO2 94%   BMI 22.87 kg/m    GEN: Well nourished, well developed, in no acute distress  HEENT: normal  Neck: no JVD, carotid bruits, or masses Cardiac: RRR; no murmurs, rubs, or gallops,no edema.  Intact distal pulses bilaterally.  Respiratory:  clear to auscultation bilaterally, normal work of breathing GI: soft, nontender, nondistended, + BS MS: no deformity or atrophy  Skin: warm and dry, no rash Neuro:  Alert and Oriented x 3, Strength and sensation are intact Psych: euthymic mood, full affect  Wt Readings from Last 3 Encounters:  01/09/22 146 lb (66.2 kg)  12/13/21 150 lb 6.4 oz (68.2 kg)  11/28/21 151 lb 3.2 oz (68.6 kg)      Studies/Labs Reviewed:   EKG:  EKG is not ordered today.    Recent Labs: 11/28/2021: ALT 25 01/05/2022: BUN 18; Creatinine, Ser 1.37; Potassium 4.6; Sodium 138   Lipid Panel No results found for: "CHOL", "TRIG", "HDL", "CHOLHDL", "VLDL", "LDLCALC", "LDLDIRECT"   CHA2DS2-VASc Score =      This indicates a  % annual risk of stroke. The patient's score is based upon:             Additional studies/ records that were reviewed today include:  None    ASSESSMENT:    1. Primary hypertension   2. HFrEF (heart failure with reduced ejection fraction) ([place])   3. DCM (dilated  cardiomyopathy) ([place])   4. Mobitz type 2 second degree AV block   5. Diarrhea, unspecified type   6. OSA (obstructive sleep apnea)      PLAN:  In order of problems listed above:  Hypertension -BP is adequately controlled on exam today -Continue prescription drug management with Entresto 49-51 mg twice daily, spironolactone 12.5 mg daily and bisoprolol 2.5 mg daily with as needed refills -I have personally reviewed and interpreted outside labs performed by patient's PCP which showed serum creatinine 1.3 and potassium 4.4 on 11/28/2021  Dilated cardiomyopathy -2D echo 10/12/2021 showed EF 25 to 30% with global hypokinesis and grade 1 diastolic dysfunction. -Etiology of cardiomyopathy unknown but he was supposed to have a coronary CTA that is to be done tomorrow -Coronary CTA is normal then we will get a cMRI to rule out infiltrative diseases -He did not tolerate carvedilol and was changed to bisoprolol -He appears euvolemic on exam today -Continue prescription drug management with Entresto 49-51 mg twice daily, spironolactone 12.5 mg daily, Jardiance 10 mg daily, bisoprolol 2.5 mg daily with as needed refills -He is now on maximum tolerated GDMT and therefore will repeat 2D echo to assess LV function on GDMT -recent cMRI showed EF persistently low at 31% with no evidence of infiltrative disease -refer back to EP for consideration of upgrade to CRT-D -check BMET  Second-degree type II AV block -Status post permanent pacemaker placement -Followed by EP and device clinic  OSA - -he did not tolerate the CPAP and stopped using it -his OSA is mainly on his back with an AHI supine of 14.3/hr and  only 4.5/hr non supine -I have encouraged him to try to avoid sleeping on his back -He feels rested in the am with no daytime sleepiness  Medication Adjustments/Labs and Tests Ordered: Current medicines are reviewed at length with the patient today.  Concerns regarding medicines are outlined above.  Medication changes, Labs and Tests ordered today are listed in the Patient Instructions below.  Followup with e in 6 months  There are no Patient Instructions on file for this visit.   Signed, Fransico Him, MD  01/09/2022 8:29 AM    [facility] [address], [place], [place]  [address] Phone: [phone]; Fax: [phone]

## 2022-01-09 NOTE — Addendum Note (Signed)
Addended by: Vergia Alcon A on: 01/09/2022 08:48 AM   Modules accepted: Orders

## 2022-01-12 NOTE — Addendum Note (Signed)
Addended by: Vergia Alcon A on: 01/12/2022 08:16 AM   Modules accepted: Orders

## 2022-01-20 ENCOUNTER — Ambulatory Visit: Payer: Medicare PPO | Attending: Internal Medicine | Admitting: Internal Medicine

## 2022-01-20 ENCOUNTER — Encounter: Payer: Self-pay | Admitting: Internal Medicine

## 2022-01-20 VITALS — BP 124/70 | HR 74 | Ht 67.0 in | Wt 149.0 lb

## 2022-01-20 DIAGNOSIS — Z950 Presence of cardiac pacemaker: Secondary | ICD-10-CM

## 2022-01-20 DIAGNOSIS — I441 Atrioventricular block, second degree: Secondary | ICD-10-CM

## 2022-01-20 DIAGNOSIS — I10 Essential (primary) hypertension: Secondary | ICD-10-CM | POA: Diagnosis not present

## 2022-01-20 LAB — CUP PACEART INCLINIC DEVICE CHECK
Date Time Interrogation Session: 20231117115708
Implantable Lead Connection Status: 753985
Implantable Lead Connection Status: 753985
Implantable Lead Implant Date: 20180125
Implantable Lead Implant Date: 20180125
Implantable Lead Location: 753859
Implantable Lead Location: 753860
Implantable Lead Model: 7740
Implantable Lead Model: 7741
Implantable Lead Serial Number: 693139
Implantable Lead Serial Number: 851154
Implantable Pulse Generator Implant Date: 20180125
Lead Channel Impedance Value: 551 Ohm
Lead Channel Impedance Value: 598 Ohm
Lead Channel Pacing Threshold Amplitude: 0.4 V
Lead Channel Pacing Threshold Amplitude: 1.3 V
Lead Channel Pacing Threshold Pulse Width: 0.4 ms
Lead Channel Pacing Threshold Pulse Width: 0.4 ms
Lead Channel Setting Pacing Amplitude: 2 V
Lead Channel Setting Pacing Amplitude: 3 V
Lead Channel Setting Pacing Pulse Width: 0.4 ms
Lead Channel Setting Sensing Sensitivity: 2.5 mV
Pulse Gen Serial Number: 773204
Zone Setting Status: 755011

## 2022-01-20 NOTE — H&P (View-Only) (Signed)
HPI [name] is referred back today to consider biv upgrade. He is a pleasant 77 yo man with chb s/p PPM insertion. He developed worsening sob and found to have an EF of 30%. He has not had syncope. He has class 2 symptoms. He remains active. He has been on GDMT.  Allergies  Allergen Reactions   Morphine And Related Nausea And Vomiting   Other Other (See Comments)    Anesthesia--difficulty waking patient     Current Outpatient Medications  Medication Sig Dispense Refill   bisoprolol (ZEBETA) 5 MG tablet Take 0.5 tablets (2.5 mg total) by mouth daily. 15 tablet 1   empagliflozin (JARDIANCE) 10 MG TABS tablet Take 1 tablet (10 mg total) by mouth daily before breakfast. 90 tablet 3   sacubitril-valsartan (ENTRESTO) 49-51 MG Take 1 tablet by mouth 2 (two) times daily. 180 tablet 3   spironolactone (ALDACTONE) 25 MG tablet Take 0.5 tablets (12.5 mg total) by mouth daily. 45 tablet 3   No current facility-administered medications for this visit.     Past Medical History:  Diagnosis Date   Anxiety    Aortic atherosclerosis ([place])    Arthritis    Complication of anesthesia    "hard time waking up"   Depression    Diverticulitis    Dysrhythmia    2:1 AV Block - pacemaker insertion by Dr. Lovena Le    Headache    Hypertension    Pneumonia    aspiration pneumonia   Presence of permanent cardiac pacemaker    managed by Dr. Lovena Le, [facility]   Prostate cancer [facility])     ROS:   All systems reviewed and negative except as noted in the HPI.   Past Surgical History:  Procedure Laterality Date   APPENDECTOMY     BACK SURGERY     CARDIAC CATHETERIZATION     in [place], [place] in the 1990's   COLONOSCOPY  03/30/2010   Moderate to severe sigmoid diverticulosis. Small internal hemorrhoids. Otherwise normal colonoscopy to TI.    EP IMPLANTABLE DEVICE N/A 03/30/2016   Procedure: Pacemaker Implant;  Surgeon: Evans Lance, MD;  Location: [facility];  Service:  Cardiovascular;  Laterality: N/A;   HERNIA REPAIR     x3   KNEE ARTHROSCOPY Bilateral    NASAL SINUS SURGERY     eye patch on left side   TOTAL KNEE ARTHROPLASTY Right 05/21/2017   Procedure: RIGHT TOTAL KNEE ARTHROPLASTY;  Surgeon: Vickey Huger, MD;  Location: [facility];  Service: Orthopedics;  Laterality: Right;   VASECTOMY       Family History  Problem Relation Age of Onset   Stroke Mother    Hypertension Father    Stroke Sister    Stroke Sister      Social History   Socioeconomic History   Marital status: Married    Spouse name: Not on file   Number of children: Not on file   Years of education: Not on file   Highest education level: Not on file  Occupational History   Not on file  Tobacco Use   Smoking status: Never   Smokeless tobacco: Never  Vaping Use   Vaping Use: Never used  Substance and Sexual Activity   Alcohol use: Yes    Alcohol/week: 3.0 standard drinks of alcohol    Types: 3 Cans of beer per week   Drug use: No   Sexual activity: Not on file  Other Topics Concern  Not on file  Social History Narrative   Not on file   Social Determinants of Health   Financial Resource Strain: Not on file  Food Insecurity: Not on file  Transportation Needs: Not on file  Physical Activity: Not on file  Stress: Not on file  Social Connections: Not on file  Intimate Partner Violence: Not on file     BP 124/70   Pulse 74   Ht '5\' 7"'$  (1.702 m)   Wt 149 lb (67.6 kg)   SpO2 96%   BMI 23.34 kg/m   Physical Exam:  Well appearing 76 yo man, NAD HEENT: Unremarkable Neck:  No JVD, no thyromegally Lymphatics:  No adenopathy Back:  No CVA tenderness Lungs:  Clear with no wheezes HEART:  Regular rate rhythm, no murmurs, no rubs, no clicks Abd:  soft, positive bowel sounds, no organomegally, no rebound, no guarding Ext:  2 plus pulses, no edema, no cyanosis, no clubbing Skin:  No rashes no nodules Neuro:  CN II through XII intact, motor grossly intact  EKG -  nsr with pacing induced LBBB  DEVICE  Normal device function.  See PaceArt for details.   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 He would like to proceed with the latter.  PPM -his medtronic DDD PM is working normally. We will follow. He has underlying CHB.  Phillip Overlie Mahdiya Mossberg,MD

## 2022-01-20 NOTE — Patient Instructions (Addendum)
Medication Instructions:  Your physician recommends that you continue on your current medications as directed. Please refer to the Current Medication list given to you today.  *If you need a refill on your cardiac medications before your next appointment, please call your pharmacy*  Lab Work:  You will have blood work drawn today;  CBC and BMET.     Testing/Procedures: None ordered.  Follow-Up:  Dr. Lovena Le is ordering a BS BiV ICD upgrade, and a Pacemaker Lead Extraction.    See Booklet for ICD information.

## 2022-01-20 NOTE — Progress Notes (Signed)
HPI Phillip Duran is referred back today to consider biv upgrade. He is a pleasant 76 yo man with chb s/p PPM insertion. He developed worsening sob and found to have an EF of 30%. He has not had syncope. He has class 2 symptoms. He remains active. He has been on GDMT.  Allergies  Allergen Reactions   Morphine And Related Nausea And Vomiting   Other Other (See Comments)    Anesthesia--difficulty waking patient     Current Outpatient Medications  Medication Sig Dispense Refill   bisoprolol (ZEBETA) 5 MG tablet Take 0.5 tablets (2.5 mg total) by mouth daily. 15 tablet 1   empagliflozin (JARDIANCE) 10 MG TABS tablet Take 1 tablet (10 mg total) by mouth daily before breakfast. 90 tablet 3   sacubitril-valsartan (ENTRESTO) 49-51 MG Take 1 tablet by mouth 2 (two) times daily. 180 tablet 3   spironolactone (ALDACTONE) 25 MG tablet Take 0.5 tablets (12.5 mg total) by mouth daily. 45 tablet 3   No current facility-administered medications for this visit.     Past Medical History:  Diagnosis Date   Anxiety    Aortic atherosclerosis ([place])    Arthritis    Complication of anesthesia    "hard time waking up"   Depression    Diverticulitis    Dysrhythmia    2:1 AV Block - pacemaker insertion by Dr. Lovena Le    Headache    Hypertension    Pneumonia    aspiration pneumonia   Presence of permanent cardiac pacemaker    managed by Dr. Lovena Le, [facility]   Prostate cancer [facility])     ROS:   All systems reviewed and negative except as noted in the HPI.   Past Surgical History:  Procedure Laterality Date   APPENDECTOMY     BACK SURGERY     CARDIAC CATHETERIZATION     in [place], [place] in the 1990's   COLONOSCOPY  03/30/2010   Moderate to severe sigmoid diverticulosis. Small internal hemorrhoids. Otherwise normal colonoscopy to TI.    EP IMPLANTABLE DEVICE N/A 03/30/2016   Procedure: Pacemaker Implant;  Surgeon: Evans Lance, MD;  Location: [facility];  Service:  Cardiovascular;  Laterality: N/A;   HERNIA REPAIR     x3   KNEE ARTHROSCOPY Bilateral    NASAL SINUS SURGERY     eye patch on left side   TOTAL KNEE ARTHROPLASTY Right 05/21/2017   Procedure: RIGHT TOTAL KNEE ARTHROPLASTY;  Surgeon: Vickey Huger, MD;  Location: [facility];  Service: Orthopedics;  Laterality: Right;   VASECTOMY       Family History  Problem Relation Age of Onset   Stroke Mother    Hypertension Father    Stroke Sister    Stroke Sister      Social History   Socioeconomic History   Marital status: Married    Spouse name: Not on file   Number of children: Not on file   Years of education: Not on file   Highest education level: Not on file  Occupational History   Not on file  Tobacco Use   Smoking status: Never   Smokeless tobacco: Never  Vaping Use   Vaping Use: Never used  Substance and Sexual Activity   Alcohol use: Yes    Alcohol/week: 3.0 standard drinks of alcohol    Types: 3 Cans of beer per week   Drug use: No   Sexual activity: Not on file  Other Topics Concern  Not on file  Social History Narrative   Not on file   Social Determinants of Health   Financial Resource Strain: Not on file  Food Insecurity: Not on file  Transportation Needs: Not on file  Physical Activity: Not on file  Stress: Not on file  Social Connections: Not on file  Intimate Partner Violence: Not on file     BP 124/70   Pulse 74   Ht '5\' 7"'$  (1.702 m)   Wt 149 lb (67.6 kg)   SpO2 96%   BMI 23.34 kg/m   Physical Exam:  Well appearing 76 yo man, NAD HEENT: Unremarkable Neck:  No JVD, no thyromegally Lymphatics:  No adenopathy Back:  No CVA tenderness Lungs:  Clear with no wheezes HEART:  Regular rate rhythm, no murmurs, no rubs, no clicks Abd:  soft, positive bowel sounds, no organomegally, no rebound, no guarding Ext:  2 plus pulses, no edema, no cyanosis, no clubbing Skin:  No rashes no nodules Neuro:  CN II through XII intact, motor grossly intact  EKG -  nsr with pacing induced LBBB  DEVICE  Normal device function.  See PaceArt for details.   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 He would like to proceed with the latter.  PPM -his medtronic DDD PM is working normally. We will follow. He has underlying CHB.  Phillip Overlie Brya Simerly,MD

## 2022-01-21 LAB — BASIC METABOLIC PANEL
BUN/Creatinine Ratio: 12 (ref 10–24)
BUN: 14 mg/dL (ref 8–27)
CO2: 21 mmol/L (ref 20–29)
Calcium: 8.8 mg/dL (ref 8.6–10.2)
Chloride: 107 mmol/L — ABNORMAL HIGH (ref 96–106)
Creatinine, Ser: 1.2 mg/dL (ref 0.76–1.27)
Glucose: 81 mg/dL (ref 70–99)
Potassium: 4.3 mmol/L (ref 3.5–5.2)
Sodium: 142 mmol/L (ref 134–144)
eGFR: 63 mL/min/{1.73_m2} (ref >59)

## 2022-01-21 LAB — CBC WITH DIFFERENTIAL/PLATELET
Basophils Absolute: 0.1 10*3/uL (ref 0.0–0.2)
Basos: 1 %
EOS (ABSOLUTE): 0.1 10*3/uL (ref 0.0–0.4)
Eos: 2 %
Hematocrit: 44.5 % (ref 37.5–51.0)
Hemoglobin: 14.6 g/dL (ref 13.0–17.7)
Immature Grans (Abs): 0 10*3/uL (ref 0.0–0.1)
Immature Granulocytes: 0 %
Lymphocytes Absolute: 2.3 10*3/uL (ref 0.7–3.1)
Lymphs: 31 %
MCH: 29.8 pg (ref 26.6–33.0)
MCHC: 32.8 g/dL (ref 31.5–35.7)
MCV: 91 fL (ref 79–97)
Monocytes Absolute: 0.7 10*3/uL (ref 0.1–0.9)
Monocytes: 10 %
Neutrophils Absolute: 4.2 10*3/uL (ref 1.4–7.0)
Neutrophils: 56 %
Platelets: 210 10*3/uL (ref 150–450)
RBC: 4.9 x10E6/uL (ref 4.14–5.80)
RDW: 12.9 % (ref 11.6–15.4)
WBC: 7.4 10*3/uL (ref 3.4–10.8)

## 2022-01-23 DIAGNOSIS — S92309A Fracture of unspecified metatarsal bone(s), unspecified foot, initial encounter for closed fracture: Secondary | ICD-10-CM | POA: Insufficient documentation

## 2022-01-24 ENCOUNTER — Telehealth: Payer: Self-pay | Admitting: Internal Medicine

## 2022-01-24 NOTE — Telephone Encounter (Signed)
Patient is scheduled for a pace maker replacement on 12/13, if there is any way possible he would like to move it up to an earlier date.

## 2022-01-24 NOTE — Telephone Encounter (Signed)
Pt called back regarding his request to move his device placement and lead extraction date up.    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  His pediatrist confirmed his fracture today, and the patient is in a boot for 6 weeks.    Pt wanted to know since his project / job has stopped, if his procedure could be moved up.  Pt told that due to the complexity of his device extraction, and new device insertion, Anesthesia and other teammates will be involved.  12/13 at 300 pm is the best time to do this, and Dr. Lovena Le only has 3 procedure days the month of December.  Pt understood.  Pt told scheduling will be reaching out to him in the near future.  No follow up with this call required at this time.

## 2022-01-25 ENCOUNTER — Telehealth: Payer: Self-pay

## 2022-01-25 NOTE — Telephone Encounter (Signed)
Pt is scheduled for a BiV-ICD upgrade with RV Lead extraction on 12/13 @ 3:00.  Dr Roxan Hockey will be the back up...  Work up is complete and pt's Wife is aware of Instructions being sent via [place].

## 2022-02-14 NOTE — Pre-Procedure Instructions (Signed)
Spoke with patients wife Joellen Jersey.  Instructed on the following items: Arrival time 1300 Nothing to eat or drink after midnight No meds AM of procedure Responsible person to drive you home and stay with you for 24 hrs Wash with special soap night before and morning of procedure

## 2022-02-14 NOTE — Progress Notes (Signed)
This patient has surgery scheduled for tomorrow afternoon. Patient received instructions via Mychart and he is aware that he must be at the hospital tomorrow at 13: 00 o'clock. Patient was encouraged to follow the instructions he received. Patient verbalized understanding.  Patient denied any signs/symptoms of COVID and he denied close contact with any person tested positive for COVID or with any symptoms of COVID.

## 2022-02-15 ENCOUNTER — Other Ambulatory Visit: Payer: Self-pay

## 2022-02-15 ENCOUNTER — Inpatient Hospital Stay (HOSPITAL_COMMUNITY)
Admission: RE | Disposition: A | Discharge status: Home / Self Care | Payer: Self-pay | Source: Ambulatory Visit | Attending: Internal Medicine

## 2022-02-15 ENCOUNTER — Ambulatory Visit (HOSPITAL_COMMUNITY)
Admission: RE | Admit: 2022-02-15 | Discharge: 2022-02-15 | Disposition: A | Discharge status: Home / Self Care | Payer: Medicare Other | Source: Ambulatory Visit | Attending: Internal Medicine | Admitting: Internal Medicine

## 2022-02-15 ENCOUNTER — Ambulatory Visit (HOSPITAL_COMMUNITY): Payer: Medicare Other | Admitting: Anesthesiology

## 2022-02-15 ENCOUNTER — Ambulatory Visit (HOSPITAL_BASED_OUTPATIENT_CLINIC_OR_DEPARTMENT_OTHER): Payer: Medicare Other | Admitting: Anesthesiology

## 2022-02-15 ENCOUNTER — Inpatient Hospital Stay (HOSPITAL_COMMUNITY)
Admission: RE | Admit: 2022-02-15 | Discharge: 2022-02-16 | DRG: 277 | Disposition: A | Discharge status: Home / Self Care | Payer: Medicare Other | Source: Ambulatory Visit | Attending: Internal Medicine | Admitting: Internal Medicine

## 2022-02-15 ENCOUNTER — Encounter (HOSPITAL_COMMUNITY): Payer: Self-pay | Admitting: Internal Medicine

## 2022-02-15 DIAGNOSIS — Z96651 Presence of right artificial knee joint: Secondary | ICD-10-CM | POA: Diagnosis present

## 2022-02-15 DIAGNOSIS — Z950 Presence of cardiac pacemaker: Secondary | ICD-10-CM

## 2022-02-15 DIAGNOSIS — I110 Hypertensive heart disease with heart failure: Secondary | ICD-10-CM | POA: Diagnosis present

## 2022-02-15 DIAGNOSIS — Z823 Family history of stroke: Secondary | ICD-10-CM

## 2022-02-15 DIAGNOSIS — Z8546 Personal history of malignant neoplasm of prostate: Secondary | ICD-10-CM | POA: Diagnosis not present

## 2022-02-15 DIAGNOSIS — Z885 Allergy status to narcotic agent status: Secondary | ICD-10-CM

## 2022-02-15 DIAGNOSIS — I441 Atrioventricular block, second degree: Principal | ICD-10-CM

## 2022-02-15 DIAGNOSIS — N189 Chronic kidney disease, unspecified: Secondary | ICD-10-CM

## 2022-02-15 DIAGNOSIS — Z7984 Long term (current) use of oral hypoglycemic drugs: Secondary | ICD-10-CM

## 2022-02-15 DIAGNOSIS — I509 Heart failure, unspecified: Secondary | ICD-10-CM

## 2022-02-15 DIAGNOSIS — Z884 Allergy status to anesthetic agent status: Secondary | ICD-10-CM

## 2022-02-15 DIAGNOSIS — I442 Atrioventricular block, complete: Secondary | ICD-10-CM

## 2022-02-15 DIAGNOSIS — I5022 Chronic systolic (congestive) heart failure: Secondary | ICD-10-CM

## 2022-02-15 DIAGNOSIS — I2510 Atherosclerotic heart disease of native coronary artery without angina pectoris: Secondary | ICD-10-CM | POA: Diagnosis present

## 2022-02-15 DIAGNOSIS — I130 Hypertensive heart and chronic kidney disease with heart failure and stage 1 through stage 4 chronic kidney disease, or unspecified chronic kidney disease: Secondary | ICD-10-CM

## 2022-02-15 DIAGNOSIS — I428 Other cardiomyopathies: Secondary | ICD-10-CM

## 2022-02-15 DIAGNOSIS — Z8249 Family history of ischemic heart disease and other diseases of the circulatory system: Secondary | ICD-10-CM

## 2022-02-15 DIAGNOSIS — F418 Other specified anxiety disorders: Secondary | ICD-10-CM | POA: Diagnosis not present

## 2022-02-15 DIAGNOSIS — I447 Left bundle-branch block, unspecified: Secondary | ICD-10-CM

## 2022-02-15 DIAGNOSIS — Z79899 Other long term (current) drug therapy: Secondary | ICD-10-CM

## 2022-02-15 HISTORY — PX: LEAD EXTRACTION: EP1211

## 2022-02-15 LAB — ECHO INTRAOPERATIVE TEE
Height: 67 in
Weight: 2363.33 oz

## 2022-02-15 LAB — SURGICAL PCR SCREEN
MRSA, PCR: NEGATIVE
Staphylococcus aureus: NEGATIVE

## 2022-02-15 LAB — PREPARE RBC (CROSSMATCH)

## 2022-02-15 SURGERY — LEAD EXTRACTION
Anesthesia: General

## 2022-02-15 MED ORDER — OXYCODONE HCL 5 MG/5ML PO SOLN: 5.0000 mg | Freq: Once | ORAL | Status: DC | PRN

## 2022-02-15 MED ORDER — SODIUM CHLORIDE 0.9 % IV SOLN
INTRAVENOUS | Status: AC
  Filled 2022-02-15: qty 2

## 2022-02-15 MED ORDER — HEPARIN (PORCINE) IN NACL 1000-0.9 UT/500ML-% IV SOLN
INTRAVENOUS | Status: DC | PRN
  Administered 2022-02-15: 500 mL

## 2022-02-15 MED ORDER — ONDANSETRON HCL 4 MG/2ML IJ SOLN
INTRAMUSCULAR | Status: AC
  Filled 2022-02-15: qty 2

## 2022-02-15 MED ORDER — MEPERIDINE HCL 25 MG/ML IJ SOLN: 6.2500 mg | INTRAMUSCULAR | Status: DC | PRN

## 2022-02-15 MED ORDER — SUGAMMADEX SODIUM 200 MG/2ML IV SOLN
INTRAVENOUS | Status: DC | PRN
  Administered 2022-02-15: 200 mg via INTRAVENOUS

## 2022-02-15 MED ORDER — ACETAMINOPHEN 325 MG PO TABS: 325.0000 mg | ORAL_TABLET | ORAL | Status: DC | PRN

## 2022-02-15 MED ORDER — FENTANYL CITRATE (PF) 250 MCG/5ML IJ SOLN
INTRAMUSCULAR | Status: DC | PRN
  Administered 2022-02-15 (×2): 50 ug via INTRAVENOUS

## 2022-02-15 MED ORDER — PHENYLEPHRINE HCL-NACL 20-0.9 MG/250ML-% IV SOLN
INTRAVENOUS | Status: DC | PRN
  Administered 2022-02-15: 30 ug/min via INTRAVENOUS

## 2022-02-15 MED ORDER — LIDOCAINE 2% (20 MG/ML) 5 ML SYRINGE
INTRAMUSCULAR | Status: DC | PRN
  Administered 2022-02-15: 40 mg via INTRAVENOUS

## 2022-02-15 MED ORDER — ETOMIDATE 2 MG/ML IV SOLN
INTRAVENOUS | Status: DC | PRN
  Administered 2022-02-15: 12 mg via INTRAVENOUS

## 2022-02-15 MED ORDER — CHLORHEXIDINE GLUCONATE 4 % EX LIQD
4.0000 | Freq: Once | CUTANEOUS | Status: DC
  Filled 2022-02-15: qty 60

## 2022-02-15 MED ORDER — HEPARIN (PORCINE) IN NACL 1000-0.9 UT/500ML-% IV SOLN
INTRAVENOUS | Status: AC
  Filled 2022-02-15: qty 500

## 2022-02-15 MED ORDER — AMISULPRIDE (ANTIEMETIC) 5 MG/2ML IV SOLN
10.0000 mg | Freq: Once | INTRAVENOUS | Status: AC
  Administered 2022-02-15: 10 mg via INTRAVENOUS

## 2022-02-15 MED ORDER — FENTANYL CITRATE (PF) 100 MCG/2ML IJ SOLN
INTRAMUSCULAR | Status: AC
  Filled 2022-02-15: qty 2

## 2022-02-15 MED ORDER — FENTANYL CITRATE (PF) 100 MCG/2ML IJ SOLN
25.0000 ug | INTRAMUSCULAR | Status: DC | PRN
  Administered 2022-02-15 (×2): 50 ug via INTRAVENOUS

## 2022-02-15 MED ORDER — SODIUM CHLORIDE 0.9 % IV SOLN
80.0000 mg | INTRAVENOUS | Status: AC
  Administered 2022-02-15: 80 mg
  Filled 2022-02-15: qty 2

## 2022-02-15 MED ORDER — SODIUM CHLORIDE 0.9% IV SOLUTION: Freq: Once | INTRAVENOUS | Status: DC

## 2022-02-15 MED ORDER — AMISULPRIDE (ANTIEMETIC) 5 MG/2ML IV SOLN
INTRAVENOUS | Status: AC
  Filled 2022-02-15: qty 4

## 2022-02-15 MED ORDER — LIDOCAINE HCL (PF) 1 % IJ SOLN
INTRAMUSCULAR | Status: AC
  Filled 2022-02-15: qty 30

## 2022-02-15 MED ORDER — ROCURONIUM BROMIDE 10 MG/ML (PF) SYRINGE
PREFILLED_SYRINGE | INTRAVENOUS | Status: DC | PRN
  Administered 2022-02-15: 40 mg via INTRAVENOUS
  Administered 2022-02-15: 50 mg via INTRAVENOUS

## 2022-02-15 MED ORDER — SODIUM CHLORIDE 0.9 % IV SOLN: INTRAVENOUS | Status: DC | PRN

## 2022-02-15 MED ORDER — CEFAZOLIN SODIUM-DEXTROSE 2-4 GM/100ML-% IV SOLN
2.0000 g | INTRAVENOUS | Status: AC
  Administered 2022-02-15: 2 g via INTRAVENOUS
  Filled 2022-02-15: qty 100

## 2022-02-15 MED ORDER — ONDANSETRON HCL 4 MG/2ML IJ SOLN: 4.0000 mg | Freq: Four times a day (QID) | INTRAMUSCULAR | Status: DC | PRN

## 2022-02-15 MED ORDER — DEXAMETHASONE SODIUM PHOSPHATE 10 MG/ML IJ SOLN
INTRAMUSCULAR | Status: DC | PRN
  Administered 2022-02-15: 10 mg via INTRAVENOUS

## 2022-02-15 MED ORDER — CEFAZOLIN SODIUM-DEXTROSE 1-4 GM/50ML-% IV SOLN
1.0000 g | Freq: Four times a day (QID) | INTRAVENOUS | Status: DC
  Administered 2022-02-16: 1 g via INTRAVENOUS

## 2022-02-15 MED ORDER — SODIUM CHLORIDE 0.9 % IV SOLN: INTRAVENOUS | Status: DC

## 2022-02-15 MED ORDER — OXYCODONE HCL 5 MG PO TABS: 5.0000 mg | ORAL_TABLET | Freq: Once | ORAL | Status: DC | PRN

## 2022-02-15 MED ORDER — LACTATED RINGERS IV SOLN: INTRAVENOUS | Status: DC

## 2022-02-15 MED ORDER — ONDANSETRON HCL 4 MG/2ML IJ SOLN: 4.0000 mg | Freq: Once | INTRAMUSCULAR | Status: DC | PRN

## 2022-02-15 MED ORDER — PROPOFOL 10 MG/ML IV BOLUS
INTRAVENOUS | Status: DC | PRN
  Administered 2022-02-15 (×2): 30 mg via INTRAVENOUS

## 2022-02-15 MED ORDER — LACTATED RINGERS IV SOLN: INTRAVENOUS | Status: DC | PRN

## 2022-02-15 MED ORDER — PHENYLEPHRINE HCL (PRESSORS) 10 MG/ML IV SOLN
INTRAVENOUS | Status: DC | PRN
  Administered 2022-02-15: 80 ug via INTRAVENOUS

## 2022-02-15 MED ORDER — ACETAMINOPHEN 160 MG/5ML PO SOLN: 325.0000 mg | ORAL | Status: DC | PRN

## 2022-02-15 MED ORDER — ONDANSETRON HCL 4 MG/2ML IJ SOLN
INTRAMUSCULAR | Status: DC | PRN
  Administered 2022-02-15: 4 mg via INTRAVENOUS

## 2022-02-15 MED ORDER — IOHEXOL 350 MG/ML SOLN
INTRAVENOUS | Status: DC | PRN
  Administered 2022-02-15: 10 mL

## 2022-02-15 SURGICAL SUPPLY — 20 items
CABLE SURGICAL S-101-97-12 (CABLE) IMPLANT
CATH ACUITYPRO 45CM H 9F (CATHETERS) IMPLANT
CATH JOSEPH QUAD ALLRED 6F REP (CATHETERS) IMPLANT
COIL ONE TIE COMPRESSION (VASCULAR PRODUCTS) IMPLANT
GUIDEWIRE ANGLED .035X150CM (WIRE) IMPLANT
ICD MOMENTUM X4 CRT-D G138 (ICD Generator) IMPLANT
LEAD ACUITY X4 4674 (Lead) IMPLANT
LEAD RELIANCE 0137-59 (Lead) IMPLANT
PAD DEFIB RADIO PHYSIO CONN (PAD) IMPLANT
POUCH AIGIS-R ANTIBACT ICD (Mesh General) ×1 IMPLANT
POUCH AIGIS-R ANTIBACT ICD LRG (Mesh General) IMPLANT
SHEATH 11 SUB-C ROTATE DILATOR (SHEATH) IMPLANT
SHEATH 8FR PRELUDE SNAP 13 (SHEATH) IMPLANT
SHEATH 9.5FR PRELUDE SNAP 13 (SHEATH) IMPLANT
SHEATH 9FR PRELUDE SNAP 13 (SHEATH) IMPLANT
SHEATH EVOLUTION RL 11F (SHEATH) IMPLANT
SHEATH PINNACLE 6F 10CM (SHEATH) IMPLANT
STYLET LIBERATOR LOCKING (MISCELLANEOUS) IMPLANT
TRAY PACEMAKER INSERTION (PACKS) IMPLANT
WIRE ACUITY WHISPER EDS 4648 (WIRE) IMPLANT

## 2022-02-15 NOTE — Anesthesia Procedure Notes (Signed)
Procedure Name: Intubation Date/Time: 02/15/2022 6:02 PM  Performed by: Darletta Moll, CRNAPre-anesthesia Checklist: Patient identified, Emergency Drugs available, Suction available and Patient being monitored Patient Re-evaluated:Patient Re-evaluated prior to induction Oxygen Delivery Method: Circle system utilized Preoxygenation: Pre-oxygenation with 100% oxygen Induction Type: IV induction Ventilation: Mask ventilation without difficulty Laryngoscope Size: Mac and 4 Grade View: Grade I Tube type: Oral Tube size: 7.5 mm Number of attempts: 1 Airway Equipment and Method: Stylet and Oral airway Placement Confirmation: ETT inserted through vocal cords under direct vision, positive ETCO2 and breath sounds checked- equal and bilateral Secured at: 22 cm Tube secured with: Tape Dental Injury: Teeth and Oropharynx as per pre-operative assessment

## 2022-02-15 NOTE — Anesthesia Procedure Notes (Signed)
Arterial Line Insertion Start/End12/13/2023 3:30 PM, 02/15/2022 3:45 PM Performed by: Belinda Block, MD, anesthesiologist  Preanesthetic checklist: patient identified, IV checked, site marked, risks and benefits discussed, surgical consent, monitors and equipment checked, pre-op evaluation and timeout performed Lidocaine 1% used for infiltration radial was placed Catheter size: 20 G Hand hygiene performed  and maximum sterile barriers used  Allen's test indicative of satisfactory collateral circulation Attempts: 1 Procedure performed using ultrasound guided technique. Ultrasound Notes:anatomy identified, needle tip was noted to be adjacent to the nerve/plexus identified and no ultrasound evidence of intravascular and/or intraneural injection Following insertion, dressing applied and Biopatch. Patient tolerated the procedure well with no immediate complications. Additional procedure comments: Previous attempt by CRNA Helseth as reported unsuccessful right and left radial  Dr. Nyoka Cowden .

## 2022-02-15 NOTE — Anesthesia Postprocedure Evaluation (Signed)
Anesthesia Post Note  Patient: Phillip Duran  Procedure(s) Performed: LEAD EXTRACTION     Patient location during evaluation: PACU Anesthesia Type: General Level of consciousness: awake and alert, patient cooperative and oriented Pain management: pain level controlled Vital Signs Assessment: post-procedure vital signs reviewed and stable Respiratory status: spontaneous breathing, nonlabored ventilation and respiratory function stable Cardiovascular status: blood pressure returned to baseline and stable Postop Assessment: no apparent nausea or vomiting Anesthetic complications: no   There were no known notable events for this encounter.  Last Vitals:  Vitals:   02/15/22 2100 02/15/22 2115  BP: 115/76 (!) 130/52  Pulse: 66 64  Resp: 16 14  Temp:    SpO2: 93% 94%    Last Pain:  Vitals:   02/15/22 2115  TempSrc:   PainSc: 0-No pain                 Phillip Duran,Phillip Duran

## 2022-02-15 NOTE — Interval H&P Note (Signed)
History and Physical Interval Note:  02/15/2022 2:33 PM  Phillip Duran  has presented today for surgery, with the diagnosis of heart failure.  The various methods of treatment have been discussed with the patient and family. After consideration of risks, benefits and other options for treatment, the patient has consented to  Procedure(s): LEAD EXTRACTION (N/A) as a surgical intervention.  The patient's history has been reviewed, patient examined, no change in status, stable for surgery.  I have reviewed the patient's chart and labs.  Questions were answered to the patient's satisfaction.     [name]

## 2022-02-15 NOTE — Anesthesia Preprocedure Evaluation (Addendum)
Anesthesia Evaluation  Patient identified by MRN, date of birth, ID band Patient awake    Reviewed: Allergy & Precautions, H&P , Patient's Chart, lab work & pertinent test results  History of Anesthesia Complications (+) PROLONGED EMERGENCE and history of anesthetic complications  Airway Mallampati: II       Dental   Pulmonary neg pulmonary ROS, pneumonia   breath sounds clear to auscultation       Cardiovascular hypertension, Pt. on medications and Pt. on home beta blockers + dysrhythmias + pacemaker  Rhythm:Regular Rate:Normal     Neuro/Psych  Headaches PSYCHIATRIC DISORDERS Anxiety Depression       GI/Hepatic negative GI ROS, Neg liver ROS,,,  Endo/Other  negative endocrine ROS    Renal/GU CRFRenal diseasenegative Renal ROS  negative genitourinary   Musculoskeletal  (+) Arthritis , Osteoarthritis,    Abdominal   Peds  Hematology negative hematology ROS (+)   Anesthesia Other Findings   Reproductive/Obstetrics negative OB ROS                             Anesthesia Physical Anesthesia Plan  ASA: 3  Anesthesia Plan: General   Post-op Pain Management:  Regional for Post-op pain and Minimal or no pain anticipated   Induction: Intravenous  PONV Risk Score and Plan: 2 and Ondansetron and Dexamethasone  Airway Management Planned: Oral ETT  Additional Equipment: None and Arterial line  Intra-op Plan:   Post-operative Plan: Possible Post-op intubation/ventilation  Informed Consent: I have reviewed the patients History and Physical, chart, labs and discussed the procedure including the risks, benefits and alternatives for the proposed anesthesia with the patient or authorized representative who has indicated his/her understanding and acceptance.     Dental advisory given  Plan Discussed with: CRNA and Anesthesiologist  Anesthesia Plan Comments: (  )        Anesthesia  Quick Evaluation

## 2022-02-15 NOTE — Transfer of Care (Signed)
Immediate Anesthesia Transfer of Care Note  Patient: Phillip Duran  Procedure(s) Performed: LEAD EXTRACTION  Patient Location: PACU  Anesthesia Type:General  Level of Consciousness: awake, alert , and oriented  Airway & Oxygen Therapy: Patient Spontanous Breathing  Post-op Assessment: Report given to RN and Post -op Vital signs reviewed and stable  Post vital signs: Reviewed and stable  Last Vitals:  Vitals Value Taken Time  BP 155/72   Temp    Pulse 60 02/15/22 2009  Resp 15 02/15/22 2009  SpO2 100 % 02/15/22 2009  Vitals shown include unvalidated device data.  Last Pain:  Vitals:   02/15/22 1344  TempSrc:   PainSc: 0-No pain         Complications: There were no known notable events for this encounter.

## 2022-02-15 NOTE — Progress Notes (Signed)
      [address]       [place] [address]             [phone]      I provided surgical back up for pacemaker lead removal from 1810 when procedure started until 1902.  Revonda Standard Roxan Hockey, MD [facility] [phone]

## 2022-02-16 ENCOUNTER — Inpatient Hospital Stay (HOSPITAL_COMMUNITY): Payer: Medicare Other

## 2022-02-16 ENCOUNTER — Encounter (HOSPITAL_COMMUNITY): Payer: Self-pay | Admitting: Internal Medicine

## 2022-02-16 DIAGNOSIS — I442 Atrioventricular block, complete: Secondary | ICD-10-CM | POA: Diagnosis not present

## 2022-02-16 DIAGNOSIS — I2510 Atherosclerotic heart disease of native coronary artery without angina pectoris: Secondary | ICD-10-CM | POA: Diagnosis not present

## 2022-02-16 MED ORDER — ACETAMINOPHEN 325 MG PO TABS: 325.0000 mg | ORAL_TABLET | ORAL | Status: DC | PRN

## 2022-02-16 MED ORDER — CEFAZOLIN SODIUM-DEXTROSE 1-4 GM/50ML-% IV SOLN
INTRAVENOUS | Status: AC
  Filled 2022-02-16: qty 50

## 2022-02-16 MED ORDER — CEFAZOLIN SODIUM-DEXTROSE 2-4 GM/100ML-% IV SOLN
INTRAVENOUS | Status: AC
  Filled 2022-02-16: qty 100

## 2022-02-16 MED FILL — Lidocaine HCl Local Preservative Free (PF) Inj 1%: INTRAMUSCULAR | Qty: 30 | Status: AC

## 2022-02-16 NOTE — Discharge Summary (Addendum)
ELECTROPHYSIOLOGY PROCEDURE DISCHARGE SUMMARY    Patient ID: Phillip Duran,  MRN: [id number], DOB/AGE: [birth year] 76 y.o.  Admit date: 02/15/2022 Discharge date: 02/16/2022  Primary Care Physician: Maris Berger, MD  Primary Cardiologist: Fransico Him, MD  Electrophysiologist: Dr. 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  Implantation of a [employer] BiV ICD on 02/15/2022 by Dr. Lovena Le.  The patient received a [employer] X4 CRT-D G138  using chronic right atrial lead and LV leads. Pt a [employer] [id number]  2.  CXR on 02/16/22 demonstrated no pneumothorax status post device implantation.      Brief HPI: Phillip Duran is a 76 y.o. male was  followed by Dr. Lovena Le for CHB   for consideration of ICD implantation.  Past medical history includes above.  The patient has persistent LV dysfunction despite guideline directed therapy.  Risks, benefits, and alternatives to ICD implantation were reviewed with the patient who wished to proceed.   Hospital Course:  The patient was admitted and underwent extraction of an old RV lead and implantation of a [employer] ICD with details as outlined above. They were monitored on telemetry overnight which demonstrated appropriate pacing .  Left chest was without hematoma or ecchymosis.  The device was interrogated and found to be functioning normally.  CXR was obtained and demonstrated no pneumothorax status post device implantation..  Wound care, arm mobility, and restrictions were reviewed with the patient.  The patient was examined and considered stable for discharge to home.   The patient's  discharge medications include an ACE-I/ARB/ARNI (entresto) and beta blocker (bisoprolol).  Anticoagulation resumption This patient is not on anticoagulation.  Physical Exam: Vitals:   02/15/22 2130 02/16/22 0000 02/16/22 0400 02/16/22 0700  BP:  124/65 113/78 115/78  Pulse:  71 72 72  Resp:  '17 18 18  '$ Temp: 97.8 F (36.6 C)   98 F (36.7 C)  TempSrc:      SpO2:  95% 95% 98%  Weight:      Height:        GEN- The patient is well appearing, alert and oriented x 3 today.   HEENT: normocephalic, atraumatic; sclera clear, conjunctiva pink; hearing intact; oropharynx clear; neck supple, no JVP Lymph- no cervical lymphadenopathy Lungs- Clear to ausculation bilaterally, normal work of breathing.  No wheezes, rales, rhonchi Heart- Regular rate and rhythm, no murmurs, rubs or gallops, PMI not laterally displaced GI- soft, non-tender, non-distended, bowel sounds present, no hepatosplenomegaly Extremities- no clubbing, cyanosis, or edema; DP/PT/radial pulses 2+ bilaterally MS- no significant deformity or atrophy Skin- warm and dry, no rash or lesion. ICD site stable Psych- euthymic mood, full affect Neuro- strength and sensation are intact   Labs:   Lab Results  Component Value Date   WBC 7.4 01/20/2022   HGB 14.6 01/20/2022   HCT 44.5 01/20/2022   MCV 91 01/20/2022   PLT 210 01/20/2022   No results for input(s): "NA", "K", "CL", "CO2", "BUN", "CREATININE", "CALCIUM", "PROT", "BILITOT", "ALKPHOS", "ALT", "AST", "GLUCOSE" in the last 168 hours.  Invalid input(s): "LABALBU"  Discharge Medications:  Allergies as of 02/16/2022       Reactions  Morphine And Related Nausea And Vomiting   Other Other (See Comments)   Anesthesia--difficulty waking patient        Medication List     TAKE these medications    acetaminophen 325 MG tablet Commonly known as: TYLENOL Take 1-2 tablets (325-650 mg total) by mouth every 4 (four) hours as needed for mild pain.   bisoprolol 5 MG  tablet Commonly known as: ZEBETA Take 0.5 tablets (2.5 mg total) by mouth daily.   empagliflozin 10 MG Tabs tablet Commonly known as: Jardiance Take 1 tablet (10 mg total) by mouth daily before breakfast.   Entresto 49-51 MG Generic drug: sacubitril-valsartan Take 1 tablet by mouth 2 (two) times daily.   spironolactone 25 MG tablet Commonly known as: ALDACTONE Take 0.5 tablets (12.5 mg total) by mouth daily.        Disposition:    Follow-up Information     [facility] Follow up.   Why: on 12/27 at 1000 am for post device check Contact information: [place] [id number] [phone]                Duration of Discharge Encounter: Greater than 30 minutes including physician time.  Jacalyn Lefevre, PA-C  02/16/2022 10:15 AM  EP Attending  Patient seen and examined. Agree with above. The patient is doing well after extraction of an RV pacing lead and a DDD PM gen and insertion of a Biv ICD, a new ICD lead and LV pacing lead placed via the coronary sinus. His ICD interrogation under my direction demonstrates normal DDD PM function. He may be discharged home with usual followup.   Carleene Overlie Eylin Pontarelli,MD

## 2022-02-16 NOTE — Discharge Instructions (Signed)
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 After 7 days, you may progress as below.  You should remove your sling 24 hours after your procedure, unless otherwise instructed by your provider.     Thursday February 23, 2022  Friday February 24, 2022 Saturday February 25, 2022 Sunday February 26, 2022   Do not lift, push, pull, or carry anything over 10 pounds with the affected arm until 6 weeks (Thursday March 30, 2022 ) after your procedure.   You may drive AFTER your wound check, unless you have been told otherwise by your provider.   Ask your healthcare provider when you can go back to work   INCISION/Dressing If you are on a blood thinner such as Coumadin, Xarelto, Eliquis, Plavix, or Pradaxa please confirm with your provider when this should be resumed.   If large square, outer bandage is left in place, this can be removed after 24 hours from your procedure. Do not remove steri-strips or glue as below.   Monitor your defibrillator site for redness, swelling, and drainage. Call the device clinic at [phone] if you experience these symptoms or fever/chills.  If your incision is sealed with Steri-strips or staples, you may shower 7 days after your procedure or when told by your provider. Do not remove the steri-strips or let the shower hit directly on your site. You may wash around your site with soap and water.    If you were discharged in a sling, please do not wear this during the day more than 48 hours after your surgery unless otherwise instructed. This may increase the risk of stiffness and soreness in your shoulder.   Avoid lotions, ointments, or perfumes over your incision until it is well-healed.  You may use a hot tub or a pool AFTER your wound check appointment if the incision is completely closed.  Your ICD is designed to protect you  from life threatening heart rhythms. Because of this, you may receive a shock.   1 shock with no symptoms:  Call the office during business hours. 1 shock with symptoms (chest pain, chest pressure, dizziness, lightheadedness, shortness of breath, overall feeling unwell):  Call 911. If you experience 2 or more shocks in 24 hours:  Call 911. If you receive a shock, you should not drive for 6 months per the [place] DMV IF you receive appropriate therapy from your ICD.   ICD Alerts:  Some alerts are vibratory and others beep. These are NOT emergencies. Please call our office to let us know. If this occurs at night or on weekends, it can wait until the next business day. Send a remote transmission.  If your device is capable of reading fluid status (for heart failure), you will be offered monthly monitoring to review this with you.   DEVICE MANAGEMENT Remote monitoring is used to monitor your ICD from home. This monitoring is scheduled every 91 days by our office. It allows [place] to keep an eye on the functioning of your device to ensure it is working properly. You will routinely see your Electrophysiologist annually (more often if necessary).   You should receive your ID card for your new device in 4-8 weeks. Keep this card with you at all times once received. Consider wearing a medical alert bracelet or necklace.  Your ICD  may be MRI compatible. This will be discussed at your  next office visit/wound check.  You should avoid contact with strong electric or magnetic fields.   Do not use amateur (ham) radio equipment or electric (arc) welding torches. MP3 player headphones with magnets should not be used. Some devices are safe to use if held at least 12 inches (30 cm) from your defibrillator. These include power tools, lawn mowers, and speakers. If you are unsure if something is safe to use, ask your health care provider.  When using your cell phone, hold it to the ear that is on the opposite side from the  defibrillator. Do not leave your cell phone in a pocket over the defibrillator.  You may safely use electric blankets, heating pads, computers, and microwave ovens.  Call the office right away if: You have chest pain. You feel more than one shock. You feel more short of breath than you have felt before. You feel more light-headed than you have felt before. Your incision starts to open up.  This information is not intended to replace advice given to you by your health care provider. Make sure you discuss any questions you have with your health care provider.

## 2022-02-17 ENCOUNTER — Encounter (HOSPITAL_COMMUNITY): Payer: Self-pay | Admitting: Internal Medicine

## 2022-02-19 LAB — BPAM RBC
Blood Product Expiration Date: 202401122359
Blood Product Expiration Date: 202401132359
Unit Type and Rh: 5100
Unit Type and Rh: 5100

## 2022-02-19 LAB — TYPE AND SCREEN
ABO/RH(D): O POS
Antibody Screen: NEGATIVE
Unit division: 0
Unit division: 0

## 2022-02-20 ENCOUNTER — Ambulatory Visit: Payer: Medicare PPO | Attending: Cardiovascular Disease

## 2022-02-20 ENCOUNTER — Telehealth: Payer: Self-pay | Admitting: Internal Medicine

## 2022-02-20 DIAGNOSIS — I420 Dilated cardiomyopathy: Secondary | ICD-10-CM

## 2022-02-20 DIAGNOSIS — I441 Atrioventricular block, second degree: Secondary | ICD-10-CM

## 2022-02-20 NOTE — Telephone Encounter (Signed)
Patient states that on last night he start having heart spasms, he states its below the pacemaker. Also is having headaches

## 2022-02-20 NOTE — Telephone Encounter (Signed)
Patient reporting "Light muscles spasms, they come and go". Started last night. Feels right below the pacemaker.  He is sending a transmission right now.  Has a dull headache that he woke up w.   No SOB, no other symptoms.  Adv will ask device clinic to be on the lookout for this and call him back with recommendations.

## 2022-02-20 NOTE — Telephone Encounter (Signed)
Spoke with patient.  He is doing well but does describe periods of "muscle twitching" just under his left breast chest area where device is. Transmission is normal.   LV pacing threshold output last tested at 2.2 V @.18m.   Sounds suspicious for diaprhragmatic stim or muscle stim on that side.  Reaching out to [employer].  Discussed with Joey.  Patient had stim after the procedure and vectors had to be adjusted.  [facility] 2pm today to eval and make adjustments.  Patient notified and verbalized understanding with appointment time.

## 2022-02-21 NOTE — Progress Notes (Signed)
Check performed today by industry rep, Phillip Duran.  Patient called in complaining of very uncomfortable diaphragmatic stim below left breast and around left side towards back.  he experienced the same immediately following implant in the hospital and initial adjustments were made; however, did return 12/17.   Industry tested all available vectors.  LV2 was the only viable option that worked.   Program changes made:  1.  Changed LV2-RV TO LV2 - CAN 2.  Programmed output SM:  changed from 4.3v @ 0.34m to 2.8v @ 1.236m   Appropriate pacing thresholds present in respect to above program SM changes. Patient was still experiencing very slight stim when lying on right side only. Phillip Duran (iVeterinary surgeondiscussed that all vector options have been tested and this is the only option.  Not ideal, but try to avoid lying on that side to manage.  If worsens or becomes too uncomfortable as is, only options would be either to turn wire off or to consider going back in for lead revision.  Patient verbalizes understanding and is comfortable with waiting and watching.  He is to come back next week on 12/27 for f/u in device clinic.  Otherwise, normal device function without any episode or alerts to report at this time.

## 2022-02-22 ENCOUNTER — Telehealth: Payer: Self-pay

## 2022-02-22 ENCOUNTER — Ambulatory Visit: Payer: Medicare PPO | Attending: Internal Medicine

## 2022-02-22 ENCOUNTER — Ambulatory Visit
Admission: RE | Admit: 2022-02-22 | Discharge: 2022-02-22 | Disposition: A | Discharge status: Home / Self Care | Payer: Medicare PPO | Source: Ambulatory Visit | Attending: Internal Medicine | Admitting: Internal Medicine

## 2022-02-22 DIAGNOSIS — I420 Dilated cardiomyopathy: Secondary | ICD-10-CM

## 2022-02-22 DIAGNOSIS — Z950 Presence of cardiac pacemaker: Secondary | ICD-10-CM

## 2022-02-22 DIAGNOSIS — I441 Atrioventricular block, second degree: Secondary | ICD-10-CM

## 2022-02-22 LAB — CUP PACEART INCLINIC DEVICE CHECK
Date Time Interrogation Session: 20231218082631
Implantable Lead Connection Status: 753985
Implantable Lead Connection Status: 753985
Implantable Lead Connection Status: 753985
Implantable Lead Implant Date: 20180125
Implantable Lead Implant Date: 20231213
Implantable Lead Implant Date: 20231213
Implantable Lead Location: 753858
Implantable Lead Location: 753859
Implantable Lead Location: 753860
Implantable Lead Model: 137
Implantable Lead Model: 4674
Implantable Lead Model: 7740
Implantable Lead Serial Number: 301279
Implantable Lead Serial Number: 693139
Implantable Lead Serial Number: 902462
Implantable Pulse Generator Implant Date: 20231213
Pulse Gen Serial Number: 391970

## 2022-02-22 NOTE — Telephone Encounter (Signed)
Patient called in stating he is having trouble laying on his left side . He thinks he needs to be seen for this discomfort.
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
## 2022-02-23 ENCOUNTER — Telehealth: Payer: Self-pay

## 2022-02-23 LAB — CUP PACEART INCLINIC DEVICE CHECK
Date Time Interrogation Session: 20231220081015
Implantable Lead Connection Status: 753985
Implantable Lead Connection Status: 753985
Implantable Lead Connection Status: 753985
Implantable Lead Implant Date: 20180125
Implantable Lead Implant Date: 20231213
Implantable Lead Implant Date: 20231213
Implantable Lead Location: 753858
Implantable Lead Location: 753859
Implantable Lead Location: 753860
Implantable Lead Model: 137
Implantable Lead Model: 4674
Implantable Lead Model: 7740
Implantable Lead Serial Number: 301279
Implantable Lead Serial Number: 693139
Implantable Lead Serial Number: 902462
Implantable Pulse Generator Implant Date: 20231213
Pulse Gen Serial Number: 391970

## 2022-02-23 NOTE — Progress Notes (Signed)
Patient back in today with progressing diaphragmatic stim following changes made on 12/18.  Dr. Lovena Le present and industry ([place]) connected via phone for device interrogation.  All vector change options thoroughly explored, no viable options that did not either produce stim reaction or fail to capture effectively.  Options reviewed with patient regarding turning off wire versus consideration of lead revision. Lead revision not a favorable option due to difficulty during implant.  For now will turn off the LV lead, keep wound check on 12/27 and then set up f/u with Dr. Lovena Le early January to review and discuss next steps.   PROGRAMMING CHANGES MADE TODAY:  Programmed Left Ventricular Intrinsic Amplitude On to Off Programmed Left Ventricular Pace Impedance On to Off Programmed LV ATP Amplitude 5.0 V to 0.1 V Programmed LV ATP Pulse Width 1.0 ms to 0.1 ms Programmed Normal Brady LV-Amplitude Trend 2.8 V to 0.1 V Programmed Normal Brady LV-Pulse Width 1.2 ms to 0.1 ms Programmed Normal Brady Ventricular Pacing Chamber BiV to RV Only Programmed Post Therapy Left Ventricle Pace Amplitude 5.0 V to 0.1 V Programmed Post Therapy Left Ventricle Pace Pulse Width 1.0 ms to 0.1 ms Programmed Normal Brady Lower Rate Limit 60 ppm to 50 ppm Programmed Normal Brady Lower Rate Limit Interval 1000 ms to 1200 ms Programmed Normal Brady Maximum Paced AV Delay 180 ms to 300 ms Programmed Normal Brady Maximum Sensed AV Delay 150 ms to 300 ms Programmed Normal Brady Minimum A-Refractory (PVARP) 240 ms to 160 ms Programmed Normal Brady Minimum Paced AV Delay 180 ms to 300 ms Programmed Normal Brady Minimum Sensed AV Delay 150 ms to 300 ms Programmed Normal Brady Mode DDDR to DDD Programmed Rate Hysteresis Offset ---  to Off Programmed Ventilatory Threshold 110 ppm to 105 pp

## 2022-02-23 NOTE — Telephone Encounter (Signed)
-----   Message from Evans Lance, MD sent at 01/25/2022 10:20 AM EST ----- Labs are ok.

## 2022-03-01 ENCOUNTER — Ambulatory Visit: Payer: Medicare PPO | Attending: Interventional Cardiology

## 2022-03-01 DIAGNOSIS — I441 Atrioventricular block, second degree: Secondary | ICD-10-CM

## 2022-03-01 LAB — CUP PACEART INCLINIC DEVICE CHECK
Date Time Interrogation Session: 20231227103416
HighPow Impedance: 70 Ohm
Implantable Lead Connection Status: 753985
Implantable Lead Connection Status: 753985
Implantable Lead Connection Status: 753985
Implantable Lead Implant Date: 20180125
Implantable Lead Implant Date: 20231213
Implantable Lead Implant Date: 20231213
Implantable Lead Location: 753858
Implantable Lead Location: 753859
Implantable Lead Location: 753860
Implantable Lead Model: 137
Implantable Lead Model: 4674
Implantable Lead Model: 7740
Implantable Lead Serial Number: 301279
Implantable Lead Serial Number: 693139
Implantable Lead Serial Number: 902462
Implantable Pulse Generator Implant Date: 20231213
Lead Channel Impedance Value: 414 Ohm
Lead Channel Impedance Value: 608 Ohm
Lead Channel Impedance Value: 822 Ohm
Lead Channel Pacing Threshold Amplitude: 0.5 V
Lead Channel Pacing Threshold Amplitude: 0.9 V
Lead Channel Pacing Threshold Amplitude: 1.6 V
Lead Channel Pacing Threshold Pulse Width: 0.4 ms
Lead Channel Pacing Threshold Pulse Width: 0.4 ms
Lead Channel Pacing Threshold Pulse Width: 1 ms
Lead Channel Setting Pacing Amplitude: 0.1 V
Lead Channel Setting Pacing Amplitude: 2 V
Lead Channel Setting Pacing Amplitude: 3.5 V
Lead Channel Setting Pacing Pulse Width: 0.1 ms
Lead Channel Setting Pacing Pulse Width: 0.4 ms
Lead Channel Setting Sensing Sensitivity: 0.5 mV
Lead Channel Setting Sensing Sensitivity: 1 mV
Pulse Gen Serial Number: 391970

## 2022-03-01 NOTE — Progress Notes (Signed)

## 2022-03-01 NOTE — Patient Instructions (Signed)

## 2022-03-09 DIAGNOSIS — S92331D Displaced fracture of third metatarsal bone, right foot, subsequent encounter for fracture with routine healing: Secondary | ICD-10-CM | POA: Insufficient documentation

## 2022-03-14 ENCOUNTER — Ambulatory Visit: Payer: Medicare PPO | Attending: Internal Medicine | Admitting: Internal Medicine

## 2022-03-14 ENCOUNTER — Encounter: Payer: Self-pay | Admitting: Internal Medicine

## 2022-03-14 VITALS — BP 130/72 | HR 58 | Ht 67.0 in | Wt 150.6 lb

## 2022-03-14 DIAGNOSIS — I10 Essential (primary) hypertension: Secondary | ICD-10-CM

## 2022-03-14 DIAGNOSIS — I442 Atrioventricular block, complete: Secondary | ICD-10-CM | POA: Insufficient documentation

## 2022-03-14 DIAGNOSIS — Z950 Presence of cardiac pacemaker: Secondary | ICD-10-CM | POA: Diagnosis not present

## 2022-03-14 LAB — CUP PACEART INCLINIC DEVICE CHECK
Date Time Interrogation Session: 20240109105557
HighPow Impedance: 59 Ohm
Implantable Lead Connection Status: 753985
Implantable Lead Connection Status: 753985
Implantable Lead Connection Status: 753985
Implantable Lead Implant Date: 20180125
Implantable Lead Implant Date: 20231213
Implantable Lead Implant Date: 20231213
Implantable Lead Location: 753858
Implantable Lead Location: 753859
Implantable Lead Location: 753860
Implantable Lead Model: 137
Implantable Lead Model: 4674
Implantable Lead Model: 7740
Implantable Lead Serial Number: 301279
Implantable Lead Serial Number: 693139
Implantable Lead Serial Number: 902462
Implantable Pulse Generator Implant Date: 20231213
Lead Channel Impedance Value: 405 Ohm
Lead Channel Impedance Value: 582 Ohm
Lead Channel Impedance Value: 793 Ohm
Lead Channel Pacing Threshold Amplitude: 0.5 V
Lead Channel Pacing Threshold Amplitude: 0.9 V
Lead Channel Pacing Threshold Amplitude: 1 V
Lead Channel Pacing Threshold Pulse Width: 0.4 ms
Lead Channel Pacing Threshold Pulse Width: 0.4 ms
Lead Channel Pacing Threshold Pulse Width: 1 ms
Lead Channel Setting Pacing Amplitude: 1.5 V
Lead Channel Setting Pacing Amplitude: 2 V
Lead Channel Setting Pacing Amplitude: 3.5 V
Lead Channel Setting Pacing Pulse Width: 0.4 ms
Lead Channel Setting Pacing Pulse Width: 1 ms
Lead Channel Setting Sensing Sensitivity: 0.5 mV
Lead Channel Setting Sensing Sensitivity: 1 mV
Pulse Gen Serial Number: 391970

## 2022-03-14 NOTE — Patient Instructions (Addendum)
Medication Instructions:  Your physician recommends that you continue on your current medications as directed. Please refer to the Current Medication list given to you today.  *If you need a refill on your cardiac medications before your next appointment, please call your pharmacy*  Lab Work: None ordered.  If you have labs (blood work) drawn today and your tests are completely normal, you will receive your results only by: [place] (if you have MyChart) OR A paper copy in the mail If you have any lab test that is abnormal or we need to change your treatment, we will call you to review the results.  Testing/Procedures: None ordered.  Follow-Up: At [facility], you and your health needs are our priority.  As part of our continuing mission to provide you with exceptional heart care, we have created designated Provider Care Teams.  These Care Teams include your primary Cardiologist (physician) and Advanced Practice Providers (APPs -  Physician Assistants and Nurse Practitioners) who all work together to provide you with the care you need, when you need it.  We recommend signing up for the patient portal called "MyChart".  Sign up information is provided on this After Visit Summary.  MyChart is used to connect with patients for Virtual Visits (Telemedicine).  Patients are able to view lab/test results, encounter notes, upcoming appointments, etc.  Non-urgent messages can be sent to your provider as well.   To learn more about what you can do with MyChart, go to [email or web address].    Your next appointment:   06/01/2022, 91 day follow up appointment with Dr. Lovena Le  The format for your next appointment:   In Person  Provider:   [name], MD{or one of the following Advanced Practice Providers on your designated Care Team:   Tommye Standard, [place] Legrand Como "Jonni Sanger" Chalmers Cater, [place]  Remote monitoring is used to monitor your Pacemaker from home. This monitoring reduces the number of  office visits required to check your device to one time per year. It allows [place] to keep an eye on the functioning of your device to ensure it is working properly. You are scheduled for a device check from home on 05/18/22. You may send your transmission at any time that day. If you have a wireless device, the transmission will be sent automatically. After your physician reviews your transmission, you will receive a postcard with your next transmission date.

## 2022-03-14 NOTE — Progress Notes (Signed)
HPI Phillip Duran returns today for followup. He is a pleasant 77 yo man with CHB s/p PPM insertion. He has done well since his last visit. He had to have his LV lead turned off and he returns today for followup. He had diaghragmatic stimulation. He feels ok. He still works on his cabins. Allergies  Allergen Reactions   Morphine And Related Nausea And Vomiting   Other Other (See Comments)    Anesthesia--difficulty waking patient     Current Outpatient Medications  Medication Sig Dispense Refill   acetaminophen (TYLENOL) 325 MG tablet Take 1-2 tablets (325-650 mg total) by mouth every 4 (four) hours as needed for mild pain.     bisoprolol (ZEBETA) 5 MG tablet Take 0.5 tablets (2.5 mg total) by mouth daily. 15 tablet 1   empagliflozin (JARDIANCE) 10 MG TABS tablet Take 1 tablet (10 mg total) by mouth daily before breakfast. 90 tablet 3   sacubitril-valsartan (ENTRESTO) 49-51 MG Take 1 tablet by mouth 2 (two) times daily. 180 tablet 3   spironolactone (ALDACTONE) 25 MG tablet Take 0.5 tablets (12.5 mg total) by mouth daily. 45 tablet 3   No current facility-administered medications for this visit.     Past Medical History:  Diagnosis Date   Anxiety    Aortic atherosclerosis ([place])    Arthritis    Complication of anesthesia    "hard time waking up"   Depression    Diverticulitis    Dysrhythmia    2:1 AV Block - pacemaker insertion by Dr. Lovena Le    Headache    Hypertension    Pneumonia    aspiration pneumonia   Presence of permanent cardiac pacemaker    managed by Dr. Lovena Le, [facility]   Prostate cancer [facility])     ROS:   All systems reviewed and negative except as noted in the HPI.   Past Surgical History:  Procedure Laterality Date   APPENDECTOMY     BACK SURGERY     CARDIAC CATHETERIZATION     in [place], [place] in the 1990's   COLONOSCOPY  03/30/2010   Moderate to severe sigmoid diverticulosis. Small internal hemorrhoids. Otherwise normal colonoscopy to  TI.    EP IMPLANTABLE DEVICE N/A 03/30/2016   Procedure: Pacemaker Implant;  Surgeon: Evans Lance, MD;  Location: [place] [facility];  Service: Cardiovascular;  Laterality: N/A;   HERNIA REPAIR     x3   KNEE ARTHROSCOPY Bilateral    LEAD EXTRACTION N/A 02/15/2022   Procedure: LEAD EXTRACTION;  Surgeon: Evans Lance, MD;  Location: [place] [facility];  Service: Cardiovascular;  Laterality: N/A;   NASAL SINUS SURGERY     eye patch on left side   TOTAL KNEE ARTHROPLASTY Right 05/21/2017   Procedure: RIGHT TOTAL KNEE ARTHROPLASTY;  Surgeon: Vickey Huger, MD;  Location: [facility];  Service: Orthopedics;  Laterality: Right;   VASECTOMY       Family History  Problem Relation Age of Onset   Stroke Mother    Hypertension Father    Stroke Sister    Stroke Sister      Social History   Socioeconomic History   Marital status: Married    Spouse name: Not on file   Number of children: Not on file   Years of education: Not on file   Highest education level: Not on file  Occupational History   Not on file  Tobacco Use   Smoking status: Never   Smokeless  tobacco: Never  Vaping Use   Vaping Use: Never used  Substance and Sexual Activity   Alcohol use: Yes    Alcohol/week: 3.0 standard drinks of alcohol    Types: 3 Cans of beer per week   Drug use: No   Sexual activity: Not on file  Other Topics Concern   Not on file  Social History Narrative   Not on file   Social Determinants of Health   Financial Resource Strain: Not on file  Food Insecurity: Not on file  Transportation Needs: Not on file  Physical Activity: Not on file  Stress: Not on file  Social Connections: Not on file  Intimate Partner Violence: Not on file     BP 130/72   Pulse (!) 58   Ht '5\' 7"'$  (1.702 m)   Wt 150 lb 9.6 oz (68.3 kg)   BMI 23.59 kg/m   Physical Exam:  Well appearing NAD HEENT: Unremarkable Neck:  No JVD, no thyromegally Lymphatics:  No adenopathy Back:  No CVA tenderness Lungs:  Clear  with no wheezes HEART:  Regular rate rhythm, no murmurs, no rubs, no clicks Abd:  soft, positive bowel sounds, no organomegally, no rebound, no guarding Ext:  2 plus pulses, no edema, no cyanosis, no clubbing Skin:  No rashes no nodules Neuro:  CN II through XII intact, motor grossly intact   DEVICE  Normal device function.  See PaceArt for details. His LV threshold was a volt at 19m.  Assess/Plan:  Chronic systolic heart failure - his symptoms are well controlled. We will continue his current meds. ICD - his Biv ICD has been reprogrammed.  HTN - his bp is well controlled.  GCarleene OverlieTaylor,MD

## 2022-03-19 ENCOUNTER — Other Ambulatory Visit: Payer: Self-pay | Admitting: Physician Assistant

## 2022-06-01 ENCOUNTER — Encounter: Payer: Self-pay | Admitting: Internal Medicine

## 2022-06-01 ENCOUNTER — Ambulatory Visit: Payer: Medicare PPO | Attending: Internal Medicine | Admitting: Internal Medicine

## 2022-06-01 VITALS — BP 112/68 | HR 51 | Ht 67.0 in | Wt 148.6 lb

## 2022-06-01 DIAGNOSIS — I10 Essential (primary) hypertension: Secondary | ICD-10-CM

## 2022-06-01 DIAGNOSIS — I442 Atrioventricular block, complete: Secondary | ICD-10-CM

## 2022-06-01 DIAGNOSIS — Z95810 Presence of automatic (implantable) cardiac defibrillator: Secondary | ICD-10-CM | POA: Diagnosis not present

## 2022-06-01 LAB — CUP PACEART INCLINIC DEVICE CHECK
Brady Statistic RA Percent Paced: 15 %
Brady Statistic RV Percent Paced: 100 %
Date Time Interrogation Session: 20240328204513
HighPow Impedance: 69 Ohm
Implantable Lead Connection Status: 753985
Implantable Lead Connection Status: 753985
Implantable Lead Connection Status: 753985
Implantable Lead Implant Date: 20180125
Implantable Lead Implant Date: 20231213
Implantable Lead Implant Date: 20231213
Implantable Lead Location: 753858
Implantable Lead Location: 753859
Implantable Lead Location: 753860
Implantable Lead Model: 137
Implantable Lead Model: 4674
Implantable Lead Model: 7740
Implantable Lead Serial Number: 301279
Implantable Lead Serial Number: 693139
Implantable Lead Serial Number: 902462
Implantable Pulse Generator Implant Date: 20231213
Lead Channel Impedance Value: 448 Ohm
Lead Channel Impedance Value: 550 Ohm
Lead Channel Impedance Value: 849 Ohm
Lead Channel Pacing Threshold Amplitude: 0.5 V
Lead Channel Pacing Threshold Amplitude: 0.9 V
Lead Channel Pacing Threshold Amplitude: 1.3 V
Lead Channel Pacing Threshold Amplitude: 1.6 V
Lead Channel Pacing Threshold Pulse Width: 0.4 ms
Lead Channel Pacing Threshold Pulse Width: 0.4 ms
Lead Channel Pacing Threshold Pulse Width: 1 ms
Lead Channel Pacing Threshold Pulse Width: 1 ms
Lead Channel Sensing Intrinsic Amplitude: 6.6 mV
Lead Channel Setting Pacing Amplitude: 2 V
Lead Channel Setting Pacing Amplitude: 2 V
Lead Channel Setting Pacing Amplitude: 2 V
Lead Channel Setting Pacing Pulse Width: 0.4 ms
Lead Channel Setting Pacing Pulse Width: 1 ms
Lead Channel Setting Sensing Sensitivity: 0.5 mV
Lead Channel Setting Sensing Sensitivity: 1 mV
Pulse Gen Serial Number: 391970

## 2022-06-01 NOTE — Progress Notes (Signed)
HPI Mr. Wallace returns today for followup. He is a pleasant 77 yo man with CHB s/p PPM insertion. He has done well since his last visit. He had to have his LV lead turned off due to diaghragmatic stim but after reprogramming, the stimulation resolved. He feels ok. He still works on his cabins. He is fishing. He feels well.  Allergies  Allergen Reactions   Morphine And Related Nausea And Vomiting   Other Other (See Comments)    Anesthesia--difficulty waking patient     Current Outpatient Medications  Medication Sig Dispense Refill   acetaminophen (TYLENOL) 325 MG tablet Take 1-2 tablets (325-650 mg total) by mouth every 4 (four) hours as needed for mild pain.     bisoprolol (ZEBETA) 5 MG tablet Take 1 tablet (5 mg total) by mouth daily. 90 tablet 3   empagliflozin (JARDIANCE) 10 MG TABS tablet Take 1 tablet (10 mg total) by mouth daily before breakfast. 90 tablet 3   sacubitril-valsartan (ENTRESTO) 49-51 MG Take 1 tablet by mouth 2 (two) times daily. 180 tablet 3   spironolactone (ALDACTONE) 25 MG tablet Take 0.5 tablets (12.5 mg total) by mouth daily. 45 tablet 3   No current facility-administered medications for this visit.     Past Medical History:  Diagnosis Date   Anxiety    Aortic atherosclerosis ([place])    Arthritis    Complication of anesthesia    "hard time waking up"   Depression    Diverticulitis    Dysrhythmia    2:1 AV Block - pacemaker insertion by Dr. Lovena Le    Headache    Hypertension    Pneumonia    aspiration pneumonia   Presence of permanent cardiac pacemaker    managed by Dr. Lovena Le, [facility]   Prostate cancer [facility])     ROS:   All systems reviewed and negative except as noted in the HPI.   Past Surgical History:  Procedure Laterality Date   APPENDECTOMY     BACK SURGERY     CARDIAC CATHETERIZATION     in [place], [place] in the 1990's   COLONOSCOPY  03/30/2010   Moderate to severe sigmoid diverticulosis. Small internal hemorrhoids.  Otherwise normal colonoscopy to TI.    EP IMPLANTABLE DEVICE N/A 03/30/2016   Procedure: Pacemaker Implant;  Surgeon: Evans Lance, MD;  Location: [place] [facility];  Service: Cardiovascular;  Laterality: N/A;   HERNIA REPAIR     x3   KNEE ARTHROSCOPY Bilateral    LEAD EXTRACTION N/A 02/15/2022   Procedure: LEAD EXTRACTION;  Surgeon: Evans Lance, MD;  Location: [place] [facility];  Service: Cardiovascular;  Laterality: N/A;   NASAL SINUS SURGERY     eye patch on left side   TOTAL KNEE ARTHROPLASTY Right 05/21/2017   Procedure: RIGHT TOTAL KNEE ARTHROPLASTY;  Surgeon: Vickey Huger, MD;  Location: [place];  Service: Orthopedics;  Laterality: Right;   VASECTOMY       Family History  Problem Relation Age of Onset   Stroke Mother    Hypertension Father    Stroke Sister    Stroke Sister      Social History   Socioeconomic History   Marital status: Married    Spouse name: Not on file   Number of children: Not on file   Years of education: Not on file   Highest education level: Not on file  Occupational History   Not on file  Tobacco Use  Smoking status: Never   Smokeless tobacco: Never  Vaping Use   Vaping Use: Never used  Substance and Sexual Activity   Alcohol use: Yes    Alcohol/week: 3.0 standard drinks of alcohol    Types: 3 Cans of beer per week   Drug use: No   Sexual activity: Not on file  Other Topics Concern   Not on file  Social History Narrative   Not on file   Social Determinants of Health   Financial Resource Strain: Not on file  Food Insecurity: Not on file  Transportation Needs: Not on file  Physical Activity: Not on file  Stress: Not on file  Social Connections: Not on file  Intimate Partner Violence: Not on file     BP 112/68   Pulse (!) 51   Ht 5\' 7"  (1.702 m)   Wt 148 lb 9.6 oz (67.4 kg)   SpO2 98%   BMI 23.27 kg/m   Physical Exam:  Well appearing NAD HEENT: Unremarkable Neck:  No JVD, no thyromegally Lymphatics:  No  adenopathy Back:  No CVA tenderness Lungs:  Clear  HEART:  Regular rate rhythm, no murmurs, no rubs, no clicks Abd:  soft, positive bowel sounds, no organomegally, no rebound, no guarding Ext:  2 plus pulses, no edema, no cyanosis, no clubbing Skin:  No rashes no nodules Neuro:  CN II through XII intact, motor grossly intact  EKG - nsr with biv pacing  DEVICE  Normal device function.  See PaceArt for details.   Assess/Plan:  Chronic systolic heart failure - his symptoms are well controlled. We will continue his current meds. ICD - his Biv ICD has been reprogrammed.  HTN - his bp is well controlled.   Carleene Overlie Brinden Kincheloe,MD

## 2022-06-01 NOTE — Patient Instructions (Signed)
Medication Instructions:  Your physician recommends that you continue on your current medications as directed. Please refer to the Current Medication list given to you today.  *If you need a refill on your cardiac medications before your next appointment, please call your pharmacy*  Lab Work: None ordered.  If you have labs (blood work) drawn today and your tests are completely normal, you will receive your results only by: [place] (if you have MyChart) OR A paper copy in the mail If you have any lab test that is abnormal or we need to change your treatment, we will call you to review the results.  Testing/Procedures: None ordered.  Follow-Up: At [facility], you and your health needs are our priority.  As part of our continuing mission to provide you with exceptional heart care, we have created designated Provider Care Teams.  These Care Teams include your primary Cardiologist (physician) and Advanced Practice Providers (APPs -  Physician Assistants and Nurse Practitioners) who all work together to provide you with the care you need, when you need it.  We recommend signing up for the patient portal called "MyChart".  Sign up information is provided on this After Visit Summary.  MyChart is used to connect with patients for Virtual Visits (Telemedicine).  Patients are able to view lab/test results, encounter notes, upcoming appointments, etc.  Non-urgent messages can be sent to your provider as well.   To learn more about what you can do with MyChart, go to [email or web address].    Your next appointment:   1 year(s)  The format for your next appointment:   In Person  Provider:   [name], MD{or one of the following Advanced Practice Providers on your designated Care Team:   Tommye Standard, [place] Legrand Como "Jonni Sanger" Chalmers Cater, [place]  Remote monitoring is used to monitor your Pacemaker/ ICD from home. This monitoring reduces the number of office visits required to check your  device to one time per year. It allows [place] to keep an eye on the functioning of your device to ensure it is working properly. You are scheduled for a device check from home on 08/17/22. You may send your transmission at any time that day. If you have a wireless device, the transmission will be sent automatically. After your physician reviews your transmission, you will receive a postcard with your next transmission date.

## 2022-07-17 ENCOUNTER — Ambulatory Visit: Payer: Medicare PPO | Admitting: Cardiology

## 2022-08-17 ENCOUNTER — Ambulatory Visit (INDEPENDENT_AMBULATORY_CARE_PROVIDER_SITE_OTHER): Payer: Medicare PPO

## 2022-08-17 DIAGNOSIS — I442 Atrioventricular block, complete: Secondary | ICD-10-CM

## 2022-08-21 LAB — CUP PACEART REMOTE DEVICE CHECK
Battery Remaining Longevity: 132 mo
Battery Remaining Percentage: 100 %
Brady Statistic RA Percent Paced: 18 %
Brady Statistic RV Percent Paced: 100 %
Date Time Interrogation Session: 20240616180200
HighPow Impedance: 72 Ohm
Implantable Lead Connection Status: 753985
Implantable Lead Connection Status: 753985
Implantable Lead Connection Status: 753985
Implantable Lead Implant Date: 20180125
Implantable Lead Implant Date: 20231213
Implantable Lead Implant Date: 20231213
Implantable Lead Location: 753858
Implantable Lead Location: 753859
Implantable Lead Location: 753860
Implantable Lead Model: 137
Implantable Lead Model: 4674
Implantable Lead Model: 7740
Implantable Lead Serial Number: 301279
Implantable Lead Serial Number: 693139
Implantable Lead Serial Number: 902462
Implantable Pulse Generator Implant Date: 20231213
Lead Channel Impedance Value: 434 Ohm
Lead Channel Impedance Value: 550 Ohm
Lead Channel Setting Pacing Amplitude: 2 V
Lead Channel Setting Pacing Amplitude: 2 V
Lead Channel Setting Pacing Amplitude: 2 V
Lead Channel Setting Pacing Pulse Width: 0.4 ms
Lead Channel Setting Pacing Pulse Width: 1 ms
Lead Channel Setting Sensing Sensitivity: 0.5 mV
Lead Channel Setting Sensing Sensitivity: 1 mV
Pulse Gen Serial Number: 391970

## 2022-09-11 NOTE — Progress Notes (Signed)
Remote ICD transmission.   

## 2022-09-26 ENCOUNTER — Other Ambulatory Visit: Payer: Self-pay

## 2022-09-26 MED ORDER — EMPAGLIFLOZIN 10 MG PO TABS: 10.0000 mg | ORAL_TABLET | Freq: Every day | ORAL | 1 refills | Status: AC

## 2022-11-13 ENCOUNTER — Telehealth: Payer: Self-pay

## 2022-11-13 NOTE — Telephone Encounter (Signed)
Pt called in wanting to know if it is possible that his device has moved because he has been having lots of indigestion. I let pt know a nurse will call back

## 2022-11-13 NOTE — Telephone Encounter (Signed)
LM returning patients call to discuss.

## 2022-11-14 NOTE — Telephone Encounter (Signed)
Call placed to Pt.  He is concerned device may have moved.  He describes having some discomfort near his shoulder where he feels like the device is rubbing.  Last transmission reviewed and WNL.  Scheduled device clinic appt for Thursday AM when GT is here.

## 2022-11-16 ENCOUNTER — Ambulatory Visit (INDEPENDENT_AMBULATORY_CARE_PROVIDER_SITE_OTHER): Payer: Medicare PPO

## 2022-11-16 ENCOUNTER — Ambulatory Visit: Payer: Medicare PPO | Attending: Cardiology

## 2022-11-16 DIAGNOSIS — Z95810 Presence of automatic (implantable) cardiac defibrillator: Secondary | ICD-10-CM

## 2022-11-16 DIAGNOSIS — I442 Atrioventricular block, complete: Secondary | ICD-10-CM | POA: Diagnosis not present

## 2022-11-16 NOTE — Progress Notes (Signed)
Patient complains of pain and movement at device site.  No signs of infection.  Pocket has healed within normal limits.  Patient is fit and trim with thin skin at device site area.  You can feel the device outline but appears to be properly in place without noted concerns upon exam.    Dr. Ladona Ridgel present and evaluated device site.  No concerns with device site and explained that some pain and discomfort is normal throughout the first year but will get better slowly with time.  He did discuss options with patient including removal and/or repositioning.  Patient agreed with Dr. Ladona Ridgel that it would be best to leave in place and continue to monitor. Nothing further needed at this point.  Patient has our device clinic number if any future concerns.

## 2022-11-23 ENCOUNTER — Ambulatory Visit (INDEPENDENT_AMBULATORY_CARE_PROVIDER_SITE_OTHER): Payer: Medicare PPO

## 2022-11-23 DIAGNOSIS — I442 Atrioventricular block, complete: Secondary | ICD-10-CM | POA: Diagnosis not present

## 2022-11-23 LAB — CUP PACEART REMOTE DEVICE CHECK
Battery Remaining Longevity: 126 mo
Battery Remaining Percentage: 100 %
Brady Statistic RA Percent Paced: 20 %
Brady Statistic RV Percent Paced: 100 %
Date Time Interrogation Session: 20240919024100
HighPow Impedance: 72 Ohm
Implantable Lead Connection Status: 753985
Implantable Lead Connection Status: 753985
Implantable Lead Connection Status: 753985
Implantable Lead Implant Date: 20180125
Implantable Lead Implant Date: 20231213
Implantable Lead Implant Date: 20231213
Implantable Lead Location: 753858
Implantable Lead Location: 753859
Implantable Lead Location: 753860
Implantable Lead Model: 137
Implantable Lead Model: 4674
Implantable Lead Model: 7740
Implantable Lead Serial Number: 301279
Implantable Lead Serial Number: 693139
Implantable Lead Serial Number: 902462
Implantable Pulse Generator Implant Date: 20231213
Lead Channel Impedance Value: 465 Ohm
Lead Channel Impedance Value: 562 Ohm
Lead Channel Setting Pacing Amplitude: 2 V
Lead Channel Setting Pacing Amplitude: 2 V
Lead Channel Setting Pacing Amplitude: 2 V
Lead Channel Setting Pacing Pulse Width: 0.4 ms
Lead Channel Setting Pacing Pulse Width: 1 ms
Lead Channel Setting Sensing Sensitivity: 0.5 mV
Lead Channel Setting Sensing Sensitivity: 1 mV
Pulse Gen Serial Number: 391970

## 2022-11-28 NOTE — Progress Notes (Signed)
Remote ICD transmission.   

## 2022-12-05 NOTE — Progress Notes (Signed)
Remote ICD transmission.   

## 2023-01-15 ENCOUNTER — Other Ambulatory Visit: Payer: Self-pay

## 2023-01-15 MED ORDER — ENTRESTO 49-51 MG PO TABS: 1.0000 | ORAL_TABLET | Freq: Two times a day (BID) | ORAL | 1 refills | Status: DC

## 2023-02-20 DIAGNOSIS — G8929 Other chronic pain: Secondary | ICD-10-CM | POA: Insufficient documentation

## 2023-02-22 ENCOUNTER — Ambulatory Visit (INDEPENDENT_AMBULATORY_CARE_PROVIDER_SITE_OTHER): Payer: Medicare PPO

## 2023-02-22 DIAGNOSIS — I442 Atrioventricular block, complete: Secondary | ICD-10-CM

## 2023-02-22 LAB — CUP PACEART REMOTE DEVICE CHECK
Battery Remaining Longevity: 126 mo
Battery Remaining Percentage: 100 %
Brady Statistic RA Percent Paced: 19 %
Brady Statistic RV Percent Paced: 100 %
Date Time Interrogation Session: 20241219032000
HighPow Impedance: 72 Ohm
Implantable Lead Connection Status: 753985
Implantable Lead Connection Status: 753985
Implantable Lead Connection Status: 753985
Implantable Lead Implant Date: 20180125
Implantable Lead Implant Date: 20231213
Implantable Lead Implant Date: 20231213
Implantable Lead Location: 753858
Implantable Lead Location: 753859
Implantable Lead Location: 753860
Implantable Lead Model: 137
Implantable Lead Model: 4674
Implantable Lead Model: 7740
Implantable Lead Serial Number: 301279
Implantable Lead Serial Number: 693139
Implantable Lead Serial Number: 902462
Implantable Pulse Generator Implant Date: 20231213
Lead Channel Impedance Value: 476 Ohm
Lead Channel Impedance Value: 576 Ohm
Lead Channel Setting Pacing Amplitude: 2 V
Lead Channel Setting Pacing Amplitude: 2 V
Lead Channel Setting Pacing Amplitude: 2 V
Lead Channel Setting Pacing Pulse Width: 0.4 ms
Lead Channel Setting Pacing Pulse Width: 1 ms
Lead Channel Setting Sensing Sensitivity: 0.5 mV
Lead Channel Setting Sensing Sensitivity: 1 mV
Pulse Gen Serial Number: 391970

## 2023-03-28 ENCOUNTER — Other Ambulatory Visit: Payer: Self-pay | Admitting: Physician Assistant

## 2023-03-28 NOTE — Progress Notes (Signed)
Remote ICD transmission.   

## 2023-05-11 ENCOUNTER — Other Ambulatory Visit: Payer: Self-pay | Admitting: Physician Assistant

## 2023-05-24 ENCOUNTER — Ambulatory Visit (INDEPENDENT_AMBULATORY_CARE_PROVIDER_SITE_OTHER): Payer: Medicare PPO

## 2023-05-24 DIAGNOSIS — I442 Atrioventricular block, complete: Secondary | ICD-10-CM

## 2023-05-28 ENCOUNTER — Encounter: Payer: Self-pay | Admitting: Internal Medicine

## 2023-05-28 LAB — CUP PACEART REMOTE DEVICE CHECK
Battery Remaining Longevity: 120 mo
Battery Remaining Percentage: 100 %
Brady Statistic RA Percent Paced: 18 %
Brady Statistic RV Percent Paced: 100 %
Date Time Interrogation Session: 20250321125700
HighPow Impedance: 66 Ohm
Implantable Lead Connection Status: 753985
Implantable Lead Connection Status: 753985
Implantable Lead Connection Status: 753985
Implantable Lead Implant Date: 20180125
Implantable Lead Implant Date: 20231213
Implantable Lead Implant Date: 20231213
Implantable Lead Location: 753858
Implantable Lead Location: 753859
Implantable Lead Location: 753860
Implantable Lead Model: 137
Implantable Lead Model: 4674
Implantable Lead Model: 7740
Implantable Lead Serial Number: 301279
Implantable Lead Serial Number: 693139
Implantable Lead Serial Number: 902462
Implantable Pulse Generator Implant Date: 20231213
Lead Channel Impedance Value: 443 Ohm
Lead Channel Impedance Value: 533 Ohm
Lead Channel Setting Pacing Amplitude: 2 V
Lead Channel Setting Pacing Amplitude: 2 V
Lead Channel Setting Pacing Amplitude: 2 V
Lead Channel Setting Pacing Pulse Width: 0.4 ms
Lead Channel Setting Pacing Pulse Width: 1 ms
Lead Channel Setting Sensing Sensitivity: 0.5 mV
Lead Channel Setting Sensing Sensitivity: 1 mV
Pulse Gen Serial Number: 391970

## 2023-06-06 DIAGNOSIS — H1032 Unspecified acute conjunctivitis, left eye: Secondary | ICD-10-CM | POA: Insufficient documentation

## 2023-06-06 DIAGNOSIS — J019 Acute sinusitis, unspecified: Secondary | ICD-10-CM | POA: Insufficient documentation

## 2023-06-06 DIAGNOSIS — H6121 Impacted cerumen, right ear: Secondary | ICD-10-CM | POA: Insufficient documentation

## 2023-07-06 NOTE — Addendum Note (Signed)
 Addended by: Lott Rouleau A on: 07/06/2023 10:53 AM   Modules accepted: Orders

## 2023-07-06 NOTE — Progress Notes (Signed)
 Remote ICD transmission.

## 2023-08-07 ENCOUNTER — Encounter (HOSPITAL_BASED_OUTPATIENT_CLINIC_OR_DEPARTMENT_OTHER): Payer: Self-pay | Admitting: Family Medicine

## 2023-08-07 ENCOUNTER — Ambulatory Visit (HOSPITAL_BASED_OUTPATIENT_CLINIC_OR_DEPARTMENT_OTHER): Admitting: Family Medicine

## 2023-08-07 VITALS — BP 129/84 | HR 74 | Temp 97.6°F | Resp 16 | Ht 67.0 in | Wt 141.0 lb

## 2023-08-07 DIAGNOSIS — J40 Bronchitis, not specified as acute or chronic: Secondary | ICD-10-CM | POA: Diagnosis not present

## 2023-08-07 DIAGNOSIS — I10 Essential (primary) hypertension: Secondary | ICD-10-CM | POA: Diagnosis not present

## 2023-08-07 DIAGNOSIS — I5020 Unspecified systolic (congestive) heart failure: Secondary | ICD-10-CM

## 2023-08-07 DIAGNOSIS — J329 Chronic sinusitis, unspecified: Secondary | ICD-10-CM | POA: Diagnosis not present

## 2023-08-07 MED ORDER — GUAIFENESIN-CODEINE 100-10 MG/5ML PO SOLN: 5.0000 mL | Freq: Three times a day (TID) | ORAL | 0 refills | Status: DC | PRN

## 2023-08-07 MED ORDER — LEVOFLOXACIN 750 MG PO TABS: 750.0000 mg | ORAL_TABLET | Freq: Every day | ORAL | 0 refills | Status: DC

## 2023-08-07 NOTE — Assessment & Plan Note (Addendum)
 Concerning presentation today.  Prompt referral back to ENT.  Probiotics.  Only light exercise while awaiting appt.  Request old records.  F/u next month to address other issues once he feels better.

## 2023-08-07 NOTE — Progress Notes (Signed)
 New Patient Office Visit  Subjective    Patient ID: Phillip Duran, male    DOB: Oct 24, 1945  Age: 78 y.o. MRN: [id number]  CC:  Chief Complaint  Patient presents with   Establish Care    Pt. Here to establish care.    Nasal Congestion    Pt. C/o of nasal congestion with a cough that's been going on for 8 weeks. Pt. Stats of having little energy. Pt. Denies pain.     HPI Phillip Duran presents to establish care F/u as above.  Has been struggling with several weeks of nasal congestion and productive cough.  Formerly f/by Dr. Vallerie Gave.  He isn't sure about his last antibiotic.  He doesn't feel allergies have been too bad.  He did have a normal CXR last week.  Reviewed his last several notes from other offices.  He denies recent antibiotics.  Outpatient Encounter Medications as of 08/07/2023  Medication Sig   albuterol (VENTOLIN HFA) 108 (90 Base) MCG/ACT inhaler Inhale 2 puffs into the lungs.   benzonatate (TESSALON) 100 MG capsule Take by mouth.   bisoprolol  (ZEBETA ) 5 MG tablet Take 1 tablet (5 mg total) by mouth daily. Pt needs to schedule appt with provider for further refills - 3rd and final attempt   Cholecalciferol (VITAMIN D3) 25 MCG (1000 UT) CAPS Take 1,000 Units by mouth.   empagliflozin  (JARDIANCE ) 10 MG TABS tablet Take 1 tablet (10 mg total) by mouth daily before breakfast.   guaiFENesin-codeine 100-10 MG/5ML syrup Take 5 mLs by mouth 3 (three) times daily as needed for cough (Sedation precautions and avoid alcohol).   levofloxacin (LEVAQUIN) 750 MG tablet Take 1 tablet (750 mg total) by mouth daily.   sacubitril-valsartan (ENTRESTO ) 49-51 MG Take 1 tablet by mouth 2 (two) times daily.   tobramycin-dexamethasone  (TOBRADEX) ophthalmic solution    acetaminophen  (TYLENOL ) 325 MG tablet Take 1-2 tablets (325-650 mg total) by mouth every 4 (four) hours as needed for mild pain.   spironolactone  (ALDACTONE ) 25 MG tablet Take 0.5 tablets (12.5 mg total) by mouth daily. (Patient not  taking: Reported on 08/07/2023)   No facility-administered encounter medications on file as of 08/07/2023.    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 Carolynne Citron    Heart failure with reduced ejection fraction [facility])    Hypertension    Presence of permanent cardiac pacemaker    managed by Dr. Carolynne Citron, [facility]   Prostate cancer [facility])     Past Surgical History:  Procedure Laterality Date   APPENDECTOMY     BACK SURGERY     CARDIAC CATHETERIZATION     in [place], [place] in the 1990's   COLONOSCOPY  03/30/2010   Moderate to severe sigmoid diverticulosis. Small internal hemorrhoids. Otherwise normal colonoscopy to TI.    EP IMPLANTABLE DEVICE N/A 03/30/2016   Procedure: Pacemaker Implant;  Surgeon: Tammie Fall, MD;  Location: [facility] INVASIVE CV LAB;  Service: Cardiovascular;  Laterality: N/A;   HERNIA REPAIR     x3   KNEE ARTHROSCOPY Bilateral    LEAD EXTRACTION N/A 02/15/2022   Procedure: LEAD EXTRACTION;  Surgeon: Tammie Fall, MD;  Location: [facility] INVASIVE CV LAB;  Service: Cardiovascular;  Laterality: N/A;   NASAL SINUS SURGERY  eye patch on left side   TOTAL KNEE ARTHROPLASTY Right 05/21/2017   Procedure: RIGHT TOTAL KNEE ARTHROPLASTY;  Surgeon: Christie Cox, MD;  Location: [facility] OR;  Service: Orthopedics;  Laterality: Right;   VASECTOMY      Family History  Problem Relation Age of Onset   Stroke Mother    Hypertension Father    Stroke Sister    Stroke Sister     Social History   Tobacco Use   Smoking status: Never   Smokeless tobacco: Never  Vaping Use   Vaping status: Never Used  Substance Use Topics   Alcohol use: Yes    Alcohol/week: 3.0 standard drinks of alcohol    Types: 3 Cans of beer per week   Drug use: No    Review of Systems  Constitutional:  Negative for diaphoresis,  fever, malaise/fatigue and weight loss.  Respiratory:  Positive for cough. Negative for shortness of breath and wheezing.   Cardiovascular:  Negative for chest pain, palpitations, orthopnea, claudication, leg swelling and PND.        Objective    BP 129/84 (BP Location: Right Arm, Patient Position: Standing, Cuff Size: Normal)   Pulse 74   Temp 97.6 F (36.4 C) (Oral)   Resp 16   Ht 5\' 7"  (1.702 m)   Wt 141 lb (64 kg)   SpO2 98%   BMI 22.08 kg/m   Physical Exam Constitutional:      General: He is not in acute distress.    Appearance: He is ill-appearing. He is not toxic-appearing.     Comments: Nontoxic, non dyspneic, but somewhat ill appearing with impressive cough  HENT:     Right Ear: Tympanic membrane normal.     Left Ear: Tympanic membrane normal.     Nose: Congestion and rhinorrhea present. Rhinorrhea is purulent.     Right Sinus: Maxillary sinus tenderness present.     Left Sinus: Maxillary sinus tenderness present.     Mouth/Throat:     Pharynx: Oropharyngeal exudate, posterior oropharyngeal erythema, uvula swelling and postnasal drip present.     Tonsils: No tonsillar abscesses.  Eyes:     Conjunctiva/sclera: Conjunctivae normal.  Cardiovascular:     Rate and Rhythm: Normal rate and regular rhythm.     Heart sounds: Normal heart sounds.  Pulmonary:     Effort: Pulmonary effort is normal.     Breath sounds: Normal breath sounds.  Abdominal:     Palpations: Abdomen is soft.     Tenderness: There is no abdominal tenderness.  Musculoskeletal:     Cervical back: Normal range of motion and neck supple. No rigidity.     Right lower leg: No edema.     Left lower leg: No edema.  Lymphadenopathy:     Cervical: Cervical adenopathy present.  Neurological:     Mental Status: He is alert.         Assessment & Plan:  Sinobronchitis Assessment & Plan: Concerning presentation today.  Prompt referral back to ENT.  Probiotics.  Only light exercise while awaiting  appt.  Request old records.  F/u next month to address other issues once he feels better.  Orders: -     levoFLOXacin; Take 1 tablet (750 mg total) by mouth daily.  Dispense: 7 tablet; Refill: 0 -     guaiFENesin-Codeine; Take 5 mLs by mouth 3 (three) times daily as needed for cough (Sedation precautions and avoid alcohol).  Dispense: 50 mL; Refill: 0 -  Ambulatory referral to ENT  Primary hypertension  HFrEF (heart failure with reduced ejection fraction) (HCC)    Return in about 4 weeks (around 09/04/2023) for chronic follow-up.   REDDING Reece Cane., MD

## 2023-08-17 ENCOUNTER — Other Ambulatory Visit: Payer: Self-pay | Admitting: Internal Medicine

## 2023-08-23 ENCOUNTER — Ambulatory Visit (INDEPENDENT_AMBULATORY_CARE_PROVIDER_SITE_OTHER): Payer: Medicare PPO

## 2023-08-23 DIAGNOSIS — I442 Atrioventricular block, complete: Secondary | ICD-10-CM

## 2023-08-27 ENCOUNTER — Other Ambulatory Visit: Payer: Self-pay

## 2023-08-27 MED ORDER — ENTRESTO 49-51 MG PO TABS: 1.0000 | ORAL_TABLET | Freq: Two times a day (BID) | ORAL | 0 refills | Status: DC

## 2023-08-29 ENCOUNTER — Ambulatory Visit (INDEPENDENT_AMBULATORY_CARE_PROVIDER_SITE_OTHER)

## 2023-08-29 DIAGNOSIS — I442 Atrioventricular block, complete: Secondary | ICD-10-CM

## 2023-08-30 LAB — CUP PACEART REMOTE DEVICE CHECK
Battery Remaining Longevity: 120 mo
Battery Remaining Percentage: 100 %
Brady Statistic RA Percent Paced: 17 %
Brady Statistic RV Percent Paced: 100 %
Date Time Interrogation Session: 20250625150800
HighPow Impedance: 70 Ohm
Implantable Lead Connection Status: 753985
Implantable Lead Connection Status: 753985
Implantable Lead Connection Status: 753985
Implantable Lead Implant Date: 20180125
Implantable Lead Implant Date: 20231213
Implantable Lead Implant Date: 20231213
Implantable Lead Location: 753858
Implantable Lead Location: 753859
Implantable Lead Location: 753860
Implantable Lead Model: 137
Implantable Lead Model: 4674
Implantable Lead Model: 7740
Implantable Lead Serial Number: 301279
Implantable Lead Serial Number: 693139
Implantable Lead Serial Number: 902462
Implantable Pulse Generator Implant Date: 20231213
Lead Channel Impedance Value: 451 Ohm
Lead Channel Impedance Value: 580 Ohm
Lead Channel Setting Pacing Amplitude: 2 V
Lead Channel Setting Pacing Amplitude: 2 V
Lead Channel Setting Pacing Amplitude: 2 V
Lead Channel Setting Pacing Pulse Width: 0.4 ms
Lead Channel Setting Pacing Pulse Width: 1 ms
Lead Channel Setting Sensing Sensitivity: 0.5 mV
Lead Channel Setting Sensing Sensitivity: 1 mV
Pulse Gen Serial Number: 391970

## 2023-09-02 ENCOUNTER — Ambulatory Visit: Payer: Self-pay | Admitting: Internal Medicine

## 2023-09-04 ENCOUNTER — Ambulatory Visit (HOSPITAL_BASED_OUTPATIENT_CLINIC_OR_DEPARTMENT_OTHER): Admitting: Family Medicine

## 2023-09-04 ENCOUNTER — Encounter (HOSPITAL_BASED_OUTPATIENT_CLINIC_OR_DEPARTMENT_OTHER): Payer: Self-pay | Admitting: Family Medicine

## 2023-09-04 VITALS — BP 163/83 | HR 56 | Temp 97.6°F | Resp 16 | Wt 146.9 lb

## 2023-09-04 DIAGNOSIS — I10 Essential (primary) hypertension: Secondary | ICD-10-CM | POA: Diagnosis not present

## 2023-09-04 DIAGNOSIS — I2510 Atherosclerotic heart disease of native coronary artery without angina pectoris: Secondary | ICD-10-CM | POA: Diagnosis not present

## 2023-09-04 DIAGNOSIS — E785 Hyperlipidemia, unspecified: Secondary | ICD-10-CM | POA: Diagnosis not present

## 2023-09-04 DIAGNOSIS — Z91199 Patient's noncompliance with other medical treatment and regimen due to unspecified reason: Secondary | ICD-10-CM | POA: Diagnosis not present

## 2023-09-04 NOTE — Assessment & Plan Note (Addendum)
 Stable, but resume meds immediately.  Alert us  if home BP isn't controlled soon.  Await labs and f/u with him soon.  Need last note from Cardiology and need to clarify why he isn't on a statin.  Promptly request last Colonoscopy report from Dr. Towana, circa 2023.

## 2023-09-04 NOTE — Progress Notes (Signed)
 Established Patient Office Visit  Subjective   Patient ID: Phillip Duran, male    DOB: June 01, 1945  Age: 78 y.o. MRN: [id number]  Chief Complaint  Patient presents with   Follow-up    Follow-up visit.    F/u as above.  Generally doing well with few concerns.  I'm concerned that he hasn't taken his medication in the last week!  He states he was told it wasn't available until today, and its unclear why.  I implored him to work this out with his pharmacy!      Past Medical History:  Diagnosis Date   Anxiety    Aortic atherosclerosis (HCC)    CKD (chronic kidney disease) stage 3, GFR 30-59 ml/min (HCC)    Heart failure with reduced ejection fraction (HCC)    Hypertension    Presence of permanent cardiac pacemaker    managed by Dr. Waddell, [facility]   Prostate cancer [facility])    in remission, known to Dr. Renda   Skin lesion    f/by [facility]    Outpatient Encounter Medications as of 09/04/2023  Medication Sig   benzonatate (TESSALON) 100 MG capsule Take by mouth.   bisoprolol  (ZEBETA ) 5 MG tablet Take 1 tablet (5 mg total) by mouth daily. Pt needs to schedule appt with provider for further refills - 3rd and final attempt   Cholecalciferol (VITAMIN D3) 25 MCG (1000 UT) CAPS Take 1,000 Units by mouth.   empagliflozin  (JARDIANCE ) 10 MG TABS tablet Take 1 tablet (10 mg total) by mouth daily before breakfast.   guaiFENesin -codeine  100-10 MG/5ML syrup Take 5 mLs by mouth 3 (three) times daily as needed for cough (Sedation precautions and avoid alcohol).   levofloxacin  (LEVAQUIN ) 750 MG tablet Take 1 tablet (750 mg total) by mouth daily.   sacubitril-valsartan (ENTRESTO ) 49-51 MG Take 1 tablet by mouth 2 (two) times daily.   albuterol (VENTOLIN HFA) 108 (90 Base) MCG/ACT inhaler Inhale 2 puffs into the lungs. (Patient not taking: Reported on 09/04/2023)   spironolactone  (ALDACTONE ) 25 MG tablet Take 0.5 tablets (12.5 mg total) by mouth daily. (Patient not taking: Reported on  08/07/2023)   tobramycin-dexamethasone  (TOBRADEX) ophthalmic solution  (Patient not taking: Reported on 09/04/2023)   No facility-administered encounter medications on file as of 09/04/2023.    Social History   Tobacco Use   Smoking status: Never   Smokeless tobacco: Never  Vaping Use   Vaping status: Never Used  Substance Use Topics   Alcohol use: Yes    Alcohol/week: 3.0 standard drinks of alcohol    Types: 3 Cans of beer per week   Drug use: No      Review of Systems  Constitutional:  Negative for diaphoresis, fever, malaise/fatigue and weight loss.  Respiratory:  Negative for cough, shortness of breath and wheezing.   Cardiovascular:  Negative for chest pain, palpitations, orthopnea, claudication, leg swelling and PND.      Objective:     BP (!) 163/83   Pulse (!) 56   Temp 97.6 F (36.4 C) (Oral)   Resp 16   Wt 146 lb 14.4 oz (66.6 kg)   SpO2 98%   BMI 23.01 kg/m    Physical Exam Constitutional:      General: He is not in acute distress.    Appearance: Normal appearance.  HENT:     Head: Normocephalic.  Neck:     Vascular: No carotid bruit.   Cardiovascular:     Rate and Rhythm: Normal rate  and regular rhythm.     Pulses: Normal pulses.     Heart sounds: Normal heart sounds.  Pulmonary:     Effort: Pulmonary effort is normal.     Breath sounds: Normal breath sounds.  Abdominal:     General: Bowel sounds are normal.     Palpations: Abdomen is soft.   Musculoskeletal:     Cervical back: Neck supple. No tenderness.     Right lower leg: No edema.     Left lower leg: No edema.   Neurological:     Mental Status: He is alert.      No results found for any visits on 09/04/23.    The 10-year ASCVD risk score (Arnett DK, et al., 2019) is: 42.1%    Assessment & Plan:  Coronary artery disease involving native heart without angina pectoris, unspecified vessel or lesion type Assessment & Plan: Stable, but resume meds immediately.  Alert us  if home  BP isn't controlled soon.  Await labs and f/u with him soon.  Need last note from Cardiology and need to clarify why he isn't on a statin.  Promptly request last Colonoscopy report from Dr. Towana, circa 2023.  Orders: -     Comprehensive metabolic panel with GFR -     CBC with Differential/Platelet  Primary hypertension  Dyslipidemia -     Lipid panel  Medically noncompliant    Return in about 3 months (around 12/05/2023) for physical.    REDDING PONCE NORLEEN FALCON., MD

## 2023-09-05 ENCOUNTER — Ambulatory Visit (HOSPITAL_BASED_OUTPATIENT_CLINIC_OR_DEPARTMENT_OTHER): Payer: Self-pay | Admitting: Family Medicine

## 2023-09-05 LAB — COMPREHENSIVE METABOLIC PANEL WITH GFR
ALT: 17 IU/L (ref 0–44)
AST: 25 IU/L (ref 0–40)
Albumin: 3.9 g/dL (ref 3.8–4.8)
Alkaline Phosphatase: 73 IU/L (ref 44–121)
BUN/Creatinine Ratio: 10 (ref 10–24)
BUN: 12 mg/dL (ref 8–27)
Bilirubin Total: 0.9 mg/dL (ref 0.0–1.2)
CO2: 22 mmol/L (ref 20–29)
Calcium: 8.5 mg/dL — ABNORMAL LOW (ref 8.6–10.2)
Chloride: 106 mmol/L (ref 96–106)
Creatinine, Ser: 1.17 mg/dL (ref 0.76–1.27)
Globulin, Total: 1.9 g/dL (ref 1.5–4.5)
Glucose: 95 mg/dL (ref 70–99)
Potassium: 4.2 mmol/L (ref 3.5–5.2)
Sodium: 141 mmol/L (ref 134–144)
Total Protein: 5.8 g/dL — ABNORMAL LOW (ref 6.0–8.5)
eGFR: 65 mL/min/{1.73_m2} (ref >59)

## 2023-09-05 LAB — CBC WITH DIFFERENTIAL/PLATELET
Basophils Absolute: 0 10*3/uL (ref 0.0–0.2)
Basos: 1 %
EOS (ABSOLUTE): 0.3 10*3/uL (ref 0.0–0.4)
Eos: 5 %
Hematocrit: 46.1 % (ref 37.5–51.0)
Hemoglobin: 14.7 g/dL (ref 13.0–17.7)
Immature Grans (Abs): 0 10*3/uL (ref 0.0–0.1)
Immature Granulocytes: 0 %
Lymphocytes Absolute: 1.8 10*3/uL (ref 0.7–3.1)
Lymphs: 32 %
MCH: 29.6 pg (ref 26.6–33.0)
MCHC: 31.9 g/dL (ref 31.5–35.7)
MCV: 93 fL (ref 79–97)
Monocytes Absolute: 0.6 10*3/uL (ref 0.1–0.9)
Monocytes: 11 %
Neutrophils Absolute: 2.8 10*3/uL (ref 1.4–7.0)
Neutrophils: 51 %
Platelets: 198 10*3/uL (ref 150–450)
RBC: 4.96 x10E6/uL (ref 4.14–5.80)
RDW: 14 % (ref 11.6–15.4)
WBC: 5.5 10*3/uL (ref 3.4–10.8)

## 2023-09-05 LAB — LIPID PANEL
Chol/HDL Ratio: 4.1 ratio (ref 0.0–5.0)
Cholesterol, Total: 174 mg/dL (ref 100–199)
HDL: 42 mg/dL (ref >39)
LDL Chol Calc (NIH): 102 mg/dL — ABNORMAL HIGH (ref 0–99)
Triglycerides: 173 mg/dL — ABNORMAL HIGH (ref 0–149)
VLDL Cholesterol Cal: 30 mg/dL (ref 5–40)

## 2023-09-06 ENCOUNTER — Encounter (INDEPENDENT_AMBULATORY_CARE_PROVIDER_SITE_OTHER): Payer: Self-pay

## 2023-09-10 ENCOUNTER — Ambulatory Visit: Payer: Self-pay

## 2023-09-10 NOTE — Telephone Encounter (Signed)
 Copied from CRM [id number]. Topic: Clinical - Red Word Triage >> Sep 10, 2023  1:44 PM Gustabo D wrote: Patient calling to speak with nurse about results   Called back to get lab results; Cardiologist, name is Dr. Waddell with [facility].  States he is unsure if he has ever been on a statin.  Reason for Disposition  Nursing judgment or information in reference  Answer Assessment - Initial Assessment Questions 1. REASON FOR CALL: What is your main concern right now?     Called to get results of lab test.  See documentation.  Protocols used: No Guideline Available-A-AH

## 2023-09-20 ENCOUNTER — Other Ambulatory Visit: Payer: Self-pay | Admitting: Internal Medicine

## 2023-09-21 ENCOUNTER — Telehealth (HOSPITAL_BASED_OUTPATIENT_CLINIC_OR_DEPARTMENT_OTHER): Payer: Self-pay | Admitting: Family Medicine

## 2023-09-21 NOTE — Telephone Encounter (Signed)
 Copied from CRM [id number]. Topic: General - Other >> Sep 21, 2023 11:42 AM Travis F wrote: Reason for CRM: Patient is calling in returning a call from the office. Please follow up with patient.

## 2023-10-31 NOTE — Progress Notes (Signed)
 Remote ICD transmission.

## 2023-11-01 ENCOUNTER — Ambulatory Visit (HOSPITAL_BASED_OUTPATIENT_CLINIC_OR_DEPARTMENT_OTHER)

## 2023-11-02 ENCOUNTER — Ambulatory Visit (INDEPENDENT_AMBULATORY_CARE_PROVIDER_SITE_OTHER): Admitting: *Deleted

## 2023-11-02 ENCOUNTER — Encounter (HOSPITAL_BASED_OUTPATIENT_CLINIC_OR_DEPARTMENT_OTHER): Payer: Self-pay

## 2023-11-02 VITALS — BP 146/74 | Resp 16 | Ht 66.54 in | Wt 141.7 lb

## 2023-11-02 DIAGNOSIS — Z0000 Encounter for general adult medical examination without abnormal findings: Secondary | ICD-10-CM

## 2023-11-02 NOTE — Progress Notes (Addendum)
 Subjective:   Phillip Duran is a 78 y.o. male who presents for Medicare Annual/Subsequent preventive examination.  Visit Complete: In person  Patient Medicare AWV questionnaire was completed by the patient on 11/02/2023; I have confirmed that all information answered by patient is correct and no changes since this date.        Objective:    There were no vitals filed for this visit. There is no height or weight on file to calculate BMI.     02/15/2022    1:41 PM 05/10/2017    8:46 AM 03/30/2016    7:16 AM  Advanced Directives  Does Patient Have a Medical Advance Directive? No No  No   Would patient like information on creating a medical advance directive?  No - Patient declined  No - Patient declined      Data saved with a previous flowsheet row definition    Current Medications (verified) Outpatient Encounter Medications as of 11/02/2023  Medication Sig   albuterol (VENTOLIN HFA) 108 (90 Base) MCG/ACT inhaler Inhale 2 puffs into the lungs. (Patient not taking: Reported on 09/04/2023)   benzonatate (TESSALON) 100 MG capsule Take by mouth.   bisoprolol  (ZEBETA ) 5 MG tablet Take 1 tablet (5 mg total) by mouth daily. Pt needs to schedule appt with provider for further refills - 3rd and final attempt   Cholecalciferol (VITAMIN D3) 25 MCG (1000 UT) CAPS Take 1,000 Units by mouth.   empagliflozin  (JARDIANCE ) 10 MG TABS tablet Take 1 tablet (10 mg total) by mouth daily before breakfast.   ENTRESTO  49-51 MG TAKE 1 TABLET TWICE DAILY (NEED MD APPOINTMENT FOR REFILLS)   guaiFENesin -codeine  100-10 MG/5ML syrup Take 5 mLs by mouth 3 (three) times daily as needed for cough (Sedation precautions and avoid alcohol).   levofloxacin  (LEVAQUIN ) 750 MG tablet Take 1 tablet (750 mg total) by mouth daily.   spironolactone  (ALDACTONE ) 25 MG tablet Take 0.5 tablets (12.5 mg total) by mouth daily. (Patient not taking: Reported on 08/07/2023)   tobramycin-dexamethasone  (TOBRADEX) ophthalmic solution   (Patient not taking: Reported on 09/04/2023)   No facility-administered encounter medications on file as of 11/02/2023.    Allergies (verified) Morphine and codeine , Other, and Motrin [ibuprofen]   History: Past Medical History:  Diagnosis Date   Anxiety    Aortic atherosclerosis (HCC)    CKD (chronic kidney disease) stage 3, GFR 30-59 ml/min (HCC)    Heart failure with reduced ejection fraction (HCC)    Hypertension    Presence of permanent cardiac pacemaker    managed by Dr. Waddell, [facility]   Prostate cancer [facility])    in remission, known to Dr. Renda   Skin lesion    f/by Phillip Duran   Past Surgical History:  Procedure Laterality Date   APPENDECTOMY     BACK SURGERY     CARDIAC CATHETERIZATION     in [place], [place] in the 1990's   COLONOSCOPY  03/30/2010   Moderate to severe sigmoid diverticulosis. Small internal hemorrhoids. Otherwise normal colonoscopy to TI.    EP IMPLANTABLE DEVICE N/A 03/30/2016   Procedure: Pacemaker Implant;  Surgeon: Phillip Duran Waddell, MD;  [facility] INVASIVE CV LAB;  Service: Cardiovascular;  Laterality: N/A;   HERNIA REPAIR     x3   KNEE ARTHROSCOPY Bilateral    LEAD EXTRACTION N/A 02/15/2022   Procedure: LEAD EXTRACTION;  Surgeon: Phillip Phillip LELON, MD;  Location: [facility] INVASIVE CV LAB;  Service: Cardiovascular;  Laterality: N/A;   NASAL  SINUS SURGERY     eye patch on left side   PROSTATECTOMY, SIMPLE, ROBOT ASSISTED, LAPAROSCOPIC     TOTAL KNEE ARTHROPLASTY Right 05/21/2017   Procedure: RIGHT TOTAL KNEE ARTHROPLASTY;  Surgeon: Phillip Kemps, MD;  Location: [facility] OR;  Service: Orthopedics;  Laterality: Right;   VASECTOMY     Family History  Problem Relation Age of Onset   Stroke Mother    Hypertension Father    Stroke Sister    Stroke Sister    Social History   Socioeconomic History   Marital status: Married    Spouse name: Not on file   Number of children: Not on file   Years of education: Not on file   Highest education  level: Not on file  Occupational History   Not on file  Tobacco Use   Smoking status: Never   Smokeless tobacco: Never  Vaping Use   Vaping status: Never Used  Substance and Sexual Activity   Alcohol use: Yes    Alcohol/week: 3.0 standard drinks of alcohol    Types: 3 Cans of beer per week   Drug use: No   Sexual activity: Not on file  Other Topics Concern   Not on file  Social History Narrative   Not on file   Social Drivers of Health   Financial Resource Strain: Low Risk  (09/04/2023)   Overall Financial Resource Strain (CARDIA)    Difficulty of Paying Living Expenses: Not hard at all  Food Insecurity: No Food Insecurity (09/04/2023)   Hunger Vital Sign    Worried About Running Out of Food in the Last Year: Never true    Ran Out of Food in the Last Year: Never true  Transportation Needs: No Transportation Needs (09/04/2023)   PRAPARE - Administrator, Civil Service (Medical): No    Lack of Transportation (Non-Medical): No  Physical Activity: Sufficiently Active (09/04/2023)   Exercise Vital Sign    Days of Exercise per Week: 7 days    Minutes of Exercise per Session: 150+ min  Stress: Stress Concern Present (09/04/2023)   Phillip Duran of Occupational Health - Occupational Stress Questionnaire    Feeling of Stress: To some extent  Social Connections: Not on file    Tobacco Counseling Counseling given: Not Answered   Clinical Intake:                        Activities of Daily Living     No data to display          Patient Care Team: Phillip Norleen PHEBE PONCE, MD as PCP - General (Family Medicine) Phillip Phillip ORN, MD as PCP - Electrophysiology (Cardiology) Phillip Wilbert SAUNDERS, MD as PCP - Cardiology (Cardiology)  Indicate any recent Medical Services you may have received from other than [facility] providers in the past year (date may be approximate).     Assessment:   This is a routine wellness examination for [name].  Hearing/Vision screen No  results found.   Goals Addressed   None    Depression Screen    08/07/2023    8:36 AM  PHQ 2/9 Scores  PHQ - 2 Score 3  PHQ- 9 Score 10    Fall Risk    08/07/2023    8:36 AM  Fall Risk   Falls in the past year? 0  Number falls in past yr: 0  Injury with Fall? 0  Risk for fall due to : No Fall  Risks  Follow up Falls evaluation completed    MEDICARE RISK AT HOME:    TIMED UP AND GO:  Was the test performed?  Yes  Length of time to ambulate 10 feet: 2 sec Gait steady and fast without use of assistive device    Cognitive Function:        Immunizations Immunization History  Administered Date(s) Administered   PNEUMOCOCCAL CONJUGATE-20 08/21/2022   Pneumococcal Conjugate-13 01/23/2017   Tdap 01/21/2022   Unspecified SARS-COV-2 Vaccination 03/31/2019, 04/28/2019, 02/04/2020   Zoster, Live 11/04/2014    TDAP status: Up to date  Flu Vaccine status: Declined, Education has been provided regarding the importance of this vaccine but patient still declined. Advised may receive this vaccine at local pharmacy or Health Dept. Aware to provide a copy of the vaccination record if obtained from local pharmacy or Health Dept. Verbalized acceptance and understanding.  Pneumococcal vaccine status: Up to date  Covid-19 vaccine status: Completed vaccines  Qualifies for Shingles Vaccine? Yes   Zostavax completed No   Shingrix Completed?: No.    Education has been provided regarding the importance of this vaccine. Patient has been advised to call insurance company to determine out of pocket expense if they have not yet received this vaccine. Advised may also receive vaccine at local pharmacy or Health Dept. Verbalized acceptance and understanding.  Screening Tests Health Maintenance  Topic Date Due   Hepatitis C Screening  Never done   Zoster Vaccines- Shingrix (1 of 2) 09/30/1965   COVID-19 Vaccine (4 - 2024-25 season) 11/05/2022   Medicare Annual Wellness (AWV)  08/21/2023    INFLUENZA VACCINE  10/05/2023   DTaP/Tdap/Td (2 - Td or Tdap) 01/22/2032   Pneumococcal Vaccine: 50+ Years  Completed   HPV VACCINES  Aged Out   Meningococcal B Vaccine  Aged Out    Health Maintenance  Health Maintenance Due  Topic Date Due   Hepatitis C Screening  Never done   Zoster Vaccines- Shingrix (1 of 2) 09/30/1965   COVID-19 Vaccine (4 - 2024-25 season) 11/05/2022   Medicare Annual Wellness (AWV)  08/21/2023   INFLUENZA VACCINE  10/05/2023    Colorectal cancer screening: Type of screening: Colonoscopy. Completed yes. Repeat every 10 years  Lung Cancer Screening: (Low Dose CT Chest recommended if Age 41-80 years, 20 pack-year currently smoking OR have quit w/in 15years.) does not qualify.    Additional Screening:  Hepatitis C Screening: does not qualify; Completed no  Vision Screening: Recommended annual ophthalmology exams for early detection of glaucoma and other disorders of the eye. Is the patient up to date with their annual eye exam?  Yes  Who is the provider or what is the name of the office in which the patient attends annual eye exams? Dr. Laurice If pt is not established with a provider, would they like to be referred to a provider to establish care? No .   Dental Screening: Recommended annual dental exams for proper oral hygiene  Diabetic Foot Exam: Diabetic Foot Exam: Overdue, Pt has been advised about the importance in completing this exam. Pt is scheduled for diabetic foot exam on n/a.  Community Resource Referral / Chronic Care Management: CRR required this visit?  No   CCM required this visit?  No     Plan:     I have personally reviewed and noted the following in the patient's chart:   Medical and social history Use of alcohol, tobacco or illicit drugs  Current medications and supplements including opioid  prescriptions. Patient is not currently taking opioid prescriptions. Functional ability and status Nutritional status Physical  activity Advanced directives List of other physicians Hospitalizations, surgeries, and ER visits in previous 12 months Vitals Screenings to include cognitive, depression, and falls Referrals and appointments  In addition, I have reviewed and discussed with patient certain preventive protocols, quality metrics, and best practice recommendations. A written personalized care plan for preventive services as well as general preventive health recommendations were provided to patient.     Joshua Shane Lover, CMA   11/02/2023   After Visit Summary: (In Person-Printed) AVS printed and given to the patient  Nurse Notes: none

## 2023-11-02 NOTE — Patient Instructions (Signed)
 Phillip Duran , Thank you for taking time out of your busy schedule to complete your Annual Wellness Visit with me. I enjoyed our conversation and look forward to speaking with you again next year. I, as well as your care team,  appreciate your ongoing commitment to your health goals. Please review the following plan we discussed and let me know if I can assist you in the future. Your Game plan/ To Do List    Referrals: If you haven't heard from the office you've been referred to, please reach out to them at the phone provided.   Follow up Visits: We will see or speak with you next year for your Next Medicare AWV with our clinical staff Have you seen your provider in the last 6 months (3 months if uncontrolled diabetes)? No  Clinician Recommendations:  Aim for 30 minutes of exercise or brisk walking, 6-8 glasses of water, and 5 servings of fruits and vegetables each day.       This is a list of the screenings recommended for you:  Health Maintenance  Topic Date Due   Hepatitis C Screening  Never done   Zoster (Shingles) Vaccine (1 of 2) 09/30/1965   COVID-19 Vaccine (4 - 2024-25 season) 11/05/2022   Flu Shot  10/05/2023   Medicare Annual Wellness Visit  11/01/2024   DTaP/Tdap/Td vaccine (2 - Td or Tdap) 01/22/2032   Pneumococcal Vaccine for age over 37  Completed   HPV Vaccine  Aged Out   Meningitis B Vaccine  Aged Out    Advanced directives: (Declined) Advance directive discussed with you today. Even though you declined this today, please call our office should you change your mind, and we can give you the proper paperwork for you to fill out. Advance Care Planning is important because it:  [x]  Makes sure you receive the medical care that is consistent with your values, goals, and preferences  [x]  It provides guidance to your family and loved ones and reduces their decisional burden about whether or not they are making the right decisions based on your wishes.  Follow the link provided  in your after visit summary or read over the paperwork we have mailed to you to help you started getting your Advance Directives in place. If you need assistance in completing these, please reach out to us  so that we can help you!  See attachments for Preventive Care and Fall Prevention Tips.

## 2023-11-29 ENCOUNTER — Ambulatory Visit

## 2023-11-29 DIAGNOSIS — I442 Atrioventricular block, complete: Secondary | ICD-10-CM

## 2023-11-29 LAB — CUP PACEART REMOTE DEVICE CHECK
Battery Remaining Longevity: 114 mo
Battery Remaining Percentage: 100 %
Brady Statistic RA Percent Paced: 18 %
Brady Statistic RV Percent Paced: 100 %
Date Time Interrogation Session: 20250924130800
HighPow Impedance: 71 Ohm
Implantable Lead Connection Status: 753985
Implantable Lead Connection Status: 753985
Implantable Lead Connection Status: 753985
Implantable Lead Implant Date: 20180125
Implantable Lead Implant Date: 20231213
Implantable Lead Implant Date: 20231213
Implantable Lead Location: 753858
Implantable Lead Location: 753859
Implantable Lead Location: 753860
Implantable Lead Model: 137
Implantable Lead Model: 4674
Implantable Lead Model: 7740
Implantable Lead Serial Number: 301279
Implantable Lead Serial Number: 693139
Implantable Lead Serial Number: 902462
Implantable Pulse Generator Implant Date: 20231213
Lead Channel Impedance Value: 419 Ohm
Lead Channel Impedance Value: 518 Ohm
Lead Channel Setting Pacing Amplitude: 2 V
Lead Channel Setting Pacing Amplitude: 2 V
Lead Channel Setting Pacing Amplitude: 2 V
Lead Channel Setting Pacing Pulse Width: 0.4 ms
Lead Channel Setting Pacing Pulse Width: 1 ms
Lead Channel Setting Sensing Sensitivity: 0.5 mV
Lead Channel Setting Sensing Sensitivity: 1 mV
Pulse Gen Serial Number: 391970

## 2023-12-02 ENCOUNTER — Ambulatory Visit: Payer: Self-pay | Admitting: Internal Medicine

## 2023-12-03 NOTE — Progress Notes (Signed)
 Remote ICD Transmission

## 2023-12-04 ENCOUNTER — Encounter (HOSPITAL_BASED_OUTPATIENT_CLINIC_OR_DEPARTMENT_OTHER): Payer: Self-pay | Admitting: Family Medicine

## 2023-12-04 ENCOUNTER — Ambulatory Visit (INDEPENDENT_AMBULATORY_CARE_PROVIDER_SITE_OTHER): Admitting: Family Medicine

## 2023-12-04 ENCOUNTER — Other Ambulatory Visit (HOSPITAL_BASED_OUTPATIENT_CLINIC_OR_DEPARTMENT_OTHER): Payer: Self-pay

## 2023-12-04 VITALS — BP 125/73 | HR 53 | Temp 98.2°F | Resp 16 | Wt 141.7 lb

## 2023-12-04 DIAGNOSIS — R5383 Other fatigue: Secondary | ICD-10-CM

## 2023-12-04 DIAGNOSIS — I2510 Atherosclerotic heart disease of native coronary artery without angina pectoris: Secondary | ICD-10-CM | POA: Diagnosis not present

## 2023-12-04 DIAGNOSIS — I10 Essential (primary) hypertension: Secondary | ICD-10-CM | POA: Diagnosis not present

## 2023-12-04 DIAGNOSIS — Z0000 Encounter for general adult medical examination without abnormal findings: Secondary | ICD-10-CM | POA: Diagnosis not present

## 2023-12-04 MED ORDER — PRAVASTATIN SODIUM 20 MG PO TABS
20.0000 mg | ORAL_TABLET | Freq: Every day | ORAL | 3 refills | Status: AC
  Filled 2023-12-04: qty 30, 30d supply, fill #0

## 2023-12-04 NOTE — Progress Notes (Signed)
 Established Patient Office Visit  Subjective   Patient ID: Phillip Duran, male    DOB: Jan 02, 1946  Age: 78 y.o. MRN: [id number]  Chief Complaint  Patient presents with   Annual Exam    Physical     F/u as above.  Overall he is doing well with few concerns.  He admits a somewhat high fat diet but gets lots of exercise.  I am concerned that he takes daily Prilosec, or states he would experience abdominal pain.  No known h/o PUD.  No h/o EGD in many years.Upon further review of his history, it isn't clear why he isn't on a statin.  He has no h/o glucose intolerance, etc and it sounds like Cardiology wrote his Jardiance  which is fine.    Past Medical History:  Diagnosis Date   Anxiety    Aortic atherosclerosis    Heart failure with reduced ejection fraction (HCC)    Hypertension    Presence of permanent cardiac pacemaker    managed by Dr. Waddell, [facility]   Prostate cancer [facility])    in remission, known to Dr. Renda   Skin lesion    f/by [facility]    Outpatient Encounter Medications as of 12/04/2023  Medication Sig   bisoprolol  (ZEBETA ) 5 MG tablet Take 1 tablet (5 mg total) by mouth daily. Pt needs to schedule appt with provider for further refills - 3rd and final attempt   Cholecalciferol (VITAMIN D3) 25 MCG (1000 UT) CAPS Take 1,000 Units by mouth.   empagliflozin  (JARDIANCE ) 10 MG TABS tablet Take 1 tablet (10 mg total) by mouth daily before breakfast.   ENTRESTO  49-51 MG TAKE 1 TABLET TWICE DAILY (NEED MD APPOINTMENT FOR REFILLS)   pravastatin (PRAVACHOL) 20 MG tablet Take 1 tablet (20 mg total) by mouth daily.   spironolactone  (ALDACTONE ) 25 MG tablet Take 0.5 tablets (12.5 mg total) by mouth daily.   [DISCONTINUED] albuterol (VENTOLIN HFA) 108 (90 Base) MCG/ACT inhaler Inhale 2 puffs into the lungs.   [DISCONTINUED] benzonatate (TESSALON) 100 MG capsule Take by mouth.   [DISCONTINUED] guaiFENesin -codeine  100-10 MG/5ML syrup Take 5 mLs by mouth 3 (three)  times daily as needed for cough (Sedation precautions and avoid alcohol).   [DISCONTINUED] levofloxacin  (LEVAQUIN ) 750 MG tablet Take 1 tablet (750 mg total) by mouth daily.   tobramycin-dexamethasone  (TOBRADEX) ophthalmic solution  (Patient not taking: Reported on 11/02/2023)   No facility-administered encounter medications on file as of 12/04/2023.    Social History   Tobacco Use   Smoking status: Never   Smokeless tobacco: Never  Vaping Use   Vaping status: Never Used  Substance Use Topics   Alcohol use: Yes    Alcohol/week: 3.0 standard drinks of alcohol    Types: 3 Cans of beer per week    Comment: Rare   Drug use: Never   Family History  Problem Relation Age of Onset   Stroke Mother    Hypertension Father    Stroke Sister    Stroke Sister        Review of Systems  Constitutional: Negative.   HENT: Negative.    Eyes: Negative.   Respiratory: Negative.    Cardiovascular: Negative.   Gastrointestinal: Negative.   Genitourinary: Negative.   Musculoskeletal: Negative.   Skin: Negative.   Neurological: Negative.   Endo/Heme/Allergies: Negative.   Psychiatric/Behavioral: Negative.        Objective:     BP 125/73 (BP Location: Right Arm, Patient Position: Standing)   Pulse ROLLEN)  53   Temp 98.2 F (36.8 C) (Oral)   Resp 16   Wt 141 lb 11.2 oz (64.3 kg)   SpO2 96%   BMI 22.50 kg/m    Physical Exam Constitutional:      General: He is not in acute distress.    Appearance: Normal appearance.  HENT:     Head: Normocephalic.     Comments: Sees eye doctor and dentist    Right Ear: Tympanic membrane normal.     Left Ear: Tympanic membrane normal.     Nose: Nose normal.     Mouth/Throat:     Pharynx: Oropharynx is clear.  Eyes:     Extraocular Movements: Extraocular movements intact.     Pupils: Pupils are equal, round, and reactive to light.  Neck:     Thyroid : No thyroid  mass, thyromegaly or thyroid  tenderness.     Vascular: No carotid bruit.      Comments: Thyroid  normal to palpation Cardiovascular:     Rate and Rhythm: Normal rate and regular rhythm.     Pulses: Normal pulses.     Heart sounds: Normal heart sounds.  Pulmonary:     Breath sounds: Normal breath sounds.  Abdominal:     General: Bowel sounds are normal. There is no distension.     Palpations: Abdomen is soft. There is no mass.     Tenderness: There is no abdominal tenderness.     Hernia: No hernia is present.  Genitourinary:    Penis: Normal.      Testes: Normal.  Musculoskeletal:        General: No tenderness. Normal range of motion.     Cervical back: Normal range of motion and neck supple. No tenderness.     Right lower leg: No edema.     Left lower leg: No edema.  Lymphadenopathy:     Cervical: No cervical adenopathy.  Skin:    General: Skin is warm and dry.     Comments: Considerable sun damage and I advised him to see the dermatologist of his choice at least annually.  Neurological:     General: No focal deficit present.     Mental Status: He is alert.  Psychiatric:        Mood and Affect: Mood normal.      No results found for any visits on 12/04/23.    The 10-year ASCVD risk score (Arnett DK, et al., 2019) is: 31%    Assessment & Plan:  Wellness examination Assessment & Plan: Commended on generally healthy habits.  F/u with specialists as directed.  Vaccines briefly reviewed today and he declines any additional vaccines for now.  I encouraged him to stop his Prilosec.  If he experiences recurring dyspepsia, I advised further discussion with GI and consideration of a repeat EGD.  F/u items include discussion of a possible bone density test.   Primary hypertension  Coronary artery disease involving native heart without angina pectoris, unspecified vessel or lesion type Assessment & Plan: Stable.  F/u with Cardiology as directed.  Trial of a low dose statin with extended discussion about possible side effects, etc.  Obviously alert us  if  tolerance problems.    Orders: -     Pravastatin Sodium; Take 1 tablet (20 mg total) by mouth daily.  Dispense: 30 tablet; Refill: 3  Fatigue, unspecified type -     Vitamin B12    No follow-ups on file.    REDDING PONCE NORLEEN FALCON., MD

## 2023-12-04 NOTE — Assessment & Plan Note (Addendum)
 Commended on generally healthy habits.  F/u with specialists as directed.  Vaccines briefly reviewed today and he declines any additional vaccines for now.  I encouraged him to stop his Prilosec.  If he experiences recurring dyspepsia, I advised further discussion with GI and consideration of a repeat EGD.  F/u items include discussion of a possible bone density test.

## 2023-12-04 NOTE — Assessment & Plan Note (Signed)
 Stable.  F/u with Cardiology as directed.  Trial of a low dose statin with extended discussion about possible side effects, etc.  Obviously alert us  if tolerance problems.

## 2023-12-05 ENCOUNTER — Ambulatory Visit (HOSPITAL_BASED_OUTPATIENT_CLINIC_OR_DEPARTMENT_OTHER): Payer: Self-pay | Admitting: Family Medicine

## 2023-12-05 LAB — VITAMIN B12: Vitamin B-12: 457 pg/mL (ref 232–1245)

## 2023-12-11 ENCOUNTER — Telehealth (HOSPITAL_BASED_OUTPATIENT_CLINIC_OR_DEPARTMENT_OTHER): Payer: Self-pay | Admitting: *Deleted

## 2023-12-11 ENCOUNTER — Other Ambulatory Visit (HOSPITAL_BASED_OUTPATIENT_CLINIC_OR_DEPARTMENT_OTHER): Payer: Self-pay

## 2023-12-11 MED ORDER — TRIAMCINOLONE ACETONIDE 0.1 % EX CREA
1.0000 | TOPICAL_CREAM | Freq: Two times a day (BID) | CUTANEOUS | 1 refills | Status: AC
  Filled 2023-12-11: qty 80, 30d supply, fill #0

## 2023-12-11 MED ORDER — TERBINAFINE HCL 250 MG PO TABS
250.0000 mg | ORAL_TABLET | Freq: Every day | ORAL | 0 refills | Status: AC
  Filled 2023-12-11: qty 30, 30d supply, fill #0

## 2023-12-12 NOTE — Telephone Encounter (Signed)
Left message on MyChart.

## 2024-01-02 NOTE — Addendum Note (Signed)
 Addended by: VICCI SELLER A on: 01/02/2024 08:55 AM   Modules accepted: Orders

## 2024-01-02 NOTE — Progress Notes (Signed)
 Remote ICD transmission.

## 2024-02-28 ENCOUNTER — Ambulatory Visit

## 2024-02-28 DIAGNOSIS — I442 Atrioventricular block, complete: Secondary | ICD-10-CM | POA: Diagnosis not present

## 2024-02-29 LAB — CUP PACEART REMOTE DEVICE CHECK
Battery Remaining Longevity: 108 mo
Battery Remaining Percentage: 100 %
Brady Statistic RA Percent Paced: 17 %
Brady Statistic RV Percent Paced: 100 %
Date Time Interrogation Session: 20251225025400
HighPow Impedance: 67 Ohm
Implantable Lead Connection Status: 753985
Implantable Lead Connection Status: 753985
Implantable Lead Connection Status: 753985
Implantable Lead Implant Date: 20180125
Implantable Lead Implant Date: 20231213
Implantable Lead Implant Date: 20231213
Implantable Lead Location: 753858
Implantable Lead Location: 753859
Implantable Lead Location: 753860
Implantable Lead Model: 137
Implantable Lead Model: 4674
Implantable Lead Model: 7740
Implantable Lead Serial Number: 301279
Implantable Lead Serial Number: 693139
Implantable Lead Serial Number: 902462
Implantable Pulse Generator Implant Date: 20231213
Lead Channel Impedance Value: 449 Ohm
Lead Channel Impedance Value: 552 Ohm
Lead Channel Setting Pacing Amplitude: 2 V
Lead Channel Setting Pacing Amplitude: 2 V
Lead Channel Setting Pacing Amplitude: 2 V
Lead Channel Setting Pacing Pulse Width: 0.4 ms
Lead Channel Setting Pacing Pulse Width: 1 ms
Lead Channel Setting Sensing Sensitivity: 0.5 mV
Lead Channel Setting Sensing Sensitivity: 1 mV
Pulse Gen Serial Number: 391970

## 2024-02-29 NOTE — Progress Notes (Signed)
 Remote ICD Transmission

## 2024-03-02 ENCOUNTER — Ambulatory Visit: Payer: Self-pay | Admitting: Internal Medicine

## 2024-05-29 ENCOUNTER — Encounter

## 2024-08-28 ENCOUNTER — Encounter

## 2024-11-27 ENCOUNTER — Encounter

## 2025-03-02 ENCOUNTER — Encounter
